# Patient Record
Sex: Male | Born: 1970 | Race: Black or African American | Hispanic: No | Marital: Single | Smoking: Never smoker
Health system: Southern US, Community
[De-identification: ages and names within clinical notes are randomized; demographics above are authoritative.]

## PROBLEM LIST (undated history)

## (undated) DIAGNOSIS — F4310 Post-traumatic stress disorder, unspecified: Secondary | ICD-10-CM

## (undated) DIAGNOSIS — F909 Attention-deficit hyperactivity disorder, unspecified type: Secondary | ICD-10-CM

## (undated) HISTORY — PX: ANKLE SURGERY: SHX546

## (undated) HISTORY — DX: Post-traumatic stress disorder, unspecified: F43.10

## (undated) HISTORY — DX: Attention-deficit hyperactivity disorder, unspecified type: F90.9

---

## 2009-09-21 ENCOUNTER — Ambulatory Visit (HOSPITAL_COMMUNITY): Admission: RE | Admit: 2009-09-21 | Discharge: 2009-09-22 | Payer: Self-pay | Admitting: Orthopedic Surgery

## 2012-01-08 ENCOUNTER — Ambulatory Visit: Payer: Self-pay | Admitting: Family Medicine

## 2012-02-05 ENCOUNTER — Ambulatory Visit: Payer: Self-pay | Admitting: Family Medicine

## 2012-02-05 DIAGNOSIS — Z0289 Encounter for other administrative examinations: Secondary | ICD-10-CM

## 2012-12-19 ENCOUNTER — Encounter: Payer: Self-pay | Admitting: Family Medicine

## 2013-01-02 ENCOUNTER — Ambulatory Visit (HOSPITAL_BASED_OUTPATIENT_CLINIC_OR_DEPARTMENT_OTHER)
Admission: RE | Admit: 2013-01-02 | Discharge: 2013-01-02 | Disposition: A | Discharge status: Home / Self Care | Payer: BC Managed Care – PPO | Source: Ambulatory Visit | Attending: Sports Medicine | Admitting: Sports Medicine

## 2013-01-02 ENCOUNTER — Ambulatory Visit (INDEPENDENT_AMBULATORY_CARE_PROVIDER_SITE_OTHER): Payer: BC Managed Care – PPO | Admitting: Sports Medicine

## 2013-01-02 ENCOUNTER — Encounter: Payer: Self-pay | Admitting: Sports Medicine

## 2013-01-02 ENCOUNTER — Ambulatory Visit: Payer: Self-pay | Admitting: Sports Medicine

## 2013-01-02 VITALS — BP 133/78 | HR 77 | Wt 228.0 lb

## 2013-01-02 DIAGNOSIS — M25519 Pain in unspecified shoulder: Secondary | ICD-10-CM

## 2013-01-02 DIAGNOSIS — M25511 Pain in right shoulder: Secondary | ICD-10-CM | POA: Insufficient documentation

## 2013-01-02 DIAGNOSIS — Z299 Encounter for prophylactic measures, unspecified: Secondary | ICD-10-CM | POA: Insufficient documentation

## 2013-01-02 MED ORDER — MELOXICAM 15 MG PO TABS: ORAL_TABLET | ORAL | Status: DC

## 2013-01-02 NOTE — Assessment & Plan Note (Signed)
Patient will return for complete physical. I am going to add some blood work so we have the results when he comes in.

## 2013-01-02 NOTE — Assessment & Plan Note (Signed)
Symptoms are specific for long head of the biceps tendinitis. Rotator cuff testing was benign. Mobic, formal physical therapy, he will cut back on his weight approximately 20% in the gym. I would like to see him back in 4 weeks if no better we can consider a guided injection into the biceps she.

## 2013-01-02 NOTE — Progress Notes (Signed)
Subjective:    CC: Establish care.   HPI:  Right shoulder pain: Present for several months, localized anterior shoulder, does not wake him from sleep, not worse with overhead activities, painful during bench press and bicep curls. Pain is localized, does not radiate although he does note occasional numbness in his pinky finger. Tried any oral medications, physical therapy for this. Pain is moderate.  Preventive measures will return for complete physical exam, would like some blood work done.  Past medical history, Surgical history, Family history not pertinant except as noted below, Social history, Allergies, and medications have been entered into the medical record, reviewed, and no changes needed.   Review of Systems: No headache, visual changes, nausea, vomiting, diarrhea, constipation, dizziness, abdominal pain, skin rash, fevers, chills, night sweats, swollen lymph nodes, weight loss, chest pain, body aches, joint swelling, muscle aches, shortness of breath, mood changes, visual or auditory hallucinations.  Objective:    General: Well Developed, well nourished, and in no acute distress.  Neuro: Alert and oriented x3, extra-ocular muscles intact, sensation grossly intact.  HEENT: Normocephalic, atraumatic, pupils equal round reactive to light, neck supple, no masses, no lymphadenopathy, thyroid nonpalpable.  Skin: Warm and dry, no rashes noted.  Cardiac: Regular rate and rhythm, no murmurs rubs or gallops.  Respiratory: Clear to auscultation bilaterally. Not using accessory muscles, speaking in full sentences.  Abdominal: Soft, nontender, nondistended, positive bowel sounds, no masses, no organomegaly.  Shoulder: Inspection reveals no abnormalities, atrophy or asymmetry. Palpation is normal with no tenderness over AC joint or bicipital groove. ROM is full in all planes. Rotator cuff strength normal throughout. No signs of impingement with negative Neer and Hawkin's tests, empty can  sign. Negative speed but positive Yergason test. No labral pathology noted with negative Obrien's, negative clunk and good stability. Normal scapular function observed. No painful arc and no drop arm sign. No apprehension sign Impression and Recommendations:   The patient was counselled, risk factors were discussed, anticipatory guidance given.

## 2013-01-06 ENCOUNTER — Ambulatory Visit: Payer: Self-pay | Admitting: Family Medicine

## 2013-01-07 ENCOUNTER — Ambulatory Visit: Payer: BC Managed Care – PPO | Admitting: Physical Therapy

## 2013-01-08 ENCOUNTER — Encounter: Payer: Self-pay | Admitting: Sports Medicine

## 2013-02-02 ENCOUNTER — Ambulatory Visit (INDEPENDENT_AMBULATORY_CARE_PROVIDER_SITE_OTHER): Payer: BC Managed Care – PPO | Admitting: Sports Medicine

## 2013-02-02 ENCOUNTER — Encounter: Payer: Self-pay | Admitting: Sports Medicine

## 2013-02-02 ENCOUNTER — Ambulatory Visit: Payer: Self-pay | Admitting: Sports Medicine

## 2013-02-02 VITALS — BP 143/88 | HR 61 | Ht 68.5 in | Wt 230.0 lb

## 2013-02-02 DIAGNOSIS — E669 Obesity, unspecified: Secondary | ICD-10-CM | POA: Insufficient documentation

## 2013-02-02 DIAGNOSIS — Z0000 Encounter for general adult medical examination without abnormal findings: Secondary | ICD-10-CM

## 2013-02-02 DIAGNOSIS — M25511 Pain in right shoulder: Secondary | ICD-10-CM

## 2013-02-02 DIAGNOSIS — Z299 Encounter for prophylactic measures, unspecified: Secondary | ICD-10-CM

## 2013-02-02 NOTE — Assessment & Plan Note (Signed)
Physical exam performed today. Awaiting blood work.

## 2013-02-02 NOTE — Progress Notes (Signed)
Subjective:    CC: Establish care.   HPI:  Complete physical exam: Doing well, no complaints, he would like to lose some weight and has been trying exercising 4 times a week. Otherwise no complaints. He did lose his job recently and is looking into other employment. He got his blood work done this morning.  Right shoulder pain: Diagnosed with biceps tendinitis by me, has been doing some rehabilitation exercises and anti-inflammatories. Overall doing much better. Pain was localized in the anterior shoulder, worse with flexion at the elbow and shoulder.  Past medical history, Surgical history, Family history not pertinant except as noted below, Social history, Allergies, and medications have been entered into the medical record, reviewed, and no changes needed.   Review of Systems: No headache, visual changes, nausea, vomiting, diarrhea, constipation, dizziness, abdominal pain, skin rash, fevers, chills, night sweats, swollen lymph nodes, weight loss, chest pain, body aches, joint swelling, muscle aches, shortness of breath, mood changes, visual or auditory hallucinations.  Objective:    General: Well Developed, well nourished, and in no acute distress.  Neuro: Alert and oriented x3, extra-ocular muscles intact, sensation grossly intact.  HEENT: Normocephalic, atraumatic, pupils equal round reactive to light, neck supple, no masses, no lymphadenopathy, thyroid nonpalpable.  Skin: Warm and dry, no rashes noted.  Cardiac: Regular rate and rhythm, no murmurs rubs or gallops.  Respiratory: Clear to auscultation bilaterally. Not using accessory muscles, speaking in full sentences.  Abdominal: Soft, nontender, nondistended, positive bowel sounds, no masses, no organomegaly.  Right Shoulder: Inspection reveals no abnormalities, atrophy or asymmetry. Palpation is normal with no tenderness over AC joint or bicipital groove. ROM is full in all planes. Rotator cuff strength normal throughout. No signs  of impingement with negative Neer and Hawkin's tests, empty can sign. Speeds and Yergason's tests normal. No labral pathology noted with negative Obrien's, negative clunk and good stability. Normal scapular function observed. No painful arc and no drop arm sign. No apprehension sign Impression and Recommendations:    The patient was counselled, risk factors were discussed, anticipatory guidance given

## 2013-02-02 NOTE — Assessment & Plan Note (Signed)
We discussed glycemic index as well as dietary strategies. He is exercising appropriately. I would like him to talk to a nutritionist. At some point in the future we could also consider weight loss medication.

## 2013-02-02 NOTE — Assessment & Plan Note (Signed)
Likely related to biceps tendinitis. Overall improving. Return as needed for this.

## 2013-02-03 ENCOUNTER — Other Ambulatory Visit: Payer: Self-pay | Admitting: Sports Medicine

## 2013-02-03 DIAGNOSIS — R718 Other abnormality of red blood cells: Secondary | ICD-10-CM | POA: Insufficient documentation

## 2013-02-03 DIAGNOSIS — E785 Hyperlipidemia, unspecified: Secondary | ICD-10-CM | POA: Insufficient documentation

## 2013-02-03 LAB — COMPREHENSIVE METABOLIC PANEL WITH GFR
ALT: 24 U/L (ref 0–53)
AST: 21 U/L (ref 0–37)
Alkaline Phosphatase: 46 U/L (ref 39–117)
Creat: 0.9 mg/dL (ref 0.50–1.35)
Total Bilirubin: 0.5 mg/dL (ref 0.3–1.2)

## 2013-02-03 LAB — COMPREHENSIVE METABOLIC PANEL
Albumin: 4.3 g/dL (ref 3.5–5.2)
BUN: 13 mg/dL (ref 6–23)
CO2: 26 mEq/L (ref 19–32)
Calcium: 9.4 mg/dL (ref 8.4–10.5)
Chloride: 100 mEq/L (ref 96–112)
Glucose, Bld: 100 mg/dL — ABNORMAL HIGH (ref 70–99)
Potassium: 4.5 mEq/L (ref 3.5–5.3)
Sodium: 135 mEq/L (ref 135–145)
Total Protein: 7.4 g/dL (ref 6.0–8.3)

## 2013-02-03 LAB — LIPID PANEL
Cholesterol: 254 mg/dL — ABNORMAL HIGH (ref 0–200)
HDL: 55 mg/dL (ref >39)
LDL Cholesterol: 179 mg/dL — ABNORMAL HIGH (ref 0–99)
Total CHOL/HDL Ratio: 4.6 ratio
Triglycerides: 100 mg/dL (ref <150)
VLDL: 20 mg/dL (ref 0–40)

## 2013-02-03 LAB — TESTOSTERONE, FREE, TOTAL, SHBG
Sex Hormone Binding: 31 nmol/L (ref 13–71)
Testosterone, Free: 81.9 pg/mL (ref 47.0–244.0)
Testosterone-% Free: 2.1 % (ref 1.6–2.9)
Testosterone: 390 ng/dL (ref 300–890)

## 2013-02-03 LAB — TSH: TSH: 2.815 u[IU]/mL (ref 0.350–4.500)

## 2013-02-03 LAB — CBC
HCT: 48.1 % (ref 39.0–52.0)
Hemoglobin: 15.9 g/dL (ref 13.0–17.0)
MCH: 25.2 pg — ABNORMAL LOW (ref 26.0–34.0)
MCHC: 33.1 g/dL (ref 30.0–36.0)
MCV: 76.3 fL — ABNORMAL LOW (ref 78.0–100.0)
Platelets: 278 K/uL (ref 150–400)
RBC: 6.3 MIL/uL — ABNORMAL HIGH (ref 4.22–5.81)
RDW: 15.4 % (ref 11.5–15.5)
WBC: 5.9 K/uL (ref 4.0–10.5)

## 2013-02-03 LAB — VITAMIN D 25 HYDROXY (VIT D DEFICIENCY, FRACTURES): Vit D, 25-Hydroxy: 14 ng/mL — ABNORMAL LOW (ref 30–89)

## 2013-02-03 MED ORDER — VITAMIN D (ERGOCALCIFEROL) 1.25 MG (50000 UNIT) PO CAPS: 50000.0000 [IU] | ORAL_CAPSULE | ORAL | Status: DC

## 2013-02-03 MED ORDER — ATORVASTATIN CALCIUM 40 MG PO TABS: 40.0000 mg | ORAL_TABLET | Freq: Every day | ORAL | Status: DC

## 2013-02-06 ENCOUNTER — Encounter: Payer: Self-pay | Admitting: Dietician

## 2013-02-06 ENCOUNTER — Encounter: Payer: BC Managed Care – PPO | Attending: Sports Medicine | Admitting: Dietician

## 2013-02-06 VITALS — Ht 69.0 in | Wt 227.4 lb

## 2013-02-06 DIAGNOSIS — E785 Hyperlipidemia, unspecified: Secondary | ICD-10-CM

## 2013-02-06 DIAGNOSIS — E669 Obesity, unspecified: Secondary | ICD-10-CM | POA: Insufficient documentation

## 2013-02-06 DIAGNOSIS — Z713 Dietary counseling and surveillance: Secondary | ICD-10-CM | POA: Insufficient documentation

## 2013-02-06 NOTE — Progress Notes (Signed)
Medical Nutrition Therapy:  Appt start time: 0800 end time:  0900.  Assessment:  Primary concerns today: wt, hyoercholesterolemia.   MEDICATIONS: see list.    DIETARY INTAKE:  Usual eating pattern includes 2 meals and 0 snacks per day.  Everyday foods include juice, deli meat, cheese.  Avoided foods include deep fried items.    24-hr recall:  B ( AM): only 3-4 days per week, scrambled eggs with onions, OJ.  Snk ( AM): none  L ( PM): sometimes cheese and deli meat, sometimes fish. Juice, soda, or water Snk ( PM): none D ( PM): variety of home cooked meals, 3 nights a week eats out. Wings at [employer], [employer] burrito bowl (rice, beans, beef, corn, cheese), steak.  Snk ( PM): none Beverages: water, juice, soda, beer (9 a week)  Pt claims lately very low appetite for last 2-3 weeks, so recall reflects lower than usual intake. Pt has recently started eating more fish, after eating lots of beef and chix.   Pt is taking Hydroxycut, has been for about 1 month to date.   Usual physical activity: 3-4 days a week of gym: 20 minutes cardio, then weights 45 minutes.   Pt claims he has been at similar wt for about 1.5-2 years, but feels out of shape, and that body comp has swung toward fat away from muscle.   Progress Towards Goal(s):  In progress.   Nutritional Diagnosis:  Glide-3.3 Overweight/obesity As related to sedentary lifestyle prior to past month, high intake of high kcal foods, such as meats and cheeses, juices, sodas.  As evidenced by BMI>30.    Intervention:  Nutrition counseling provided regarding wt loss approaches, portion control, greater inclusion of nonstarchy vegetables. RD also advised pt on improving vitamin D status through supplementation, increased dairy intake. RD advised additional increases in PA to aid in wt loss, alleviate dyslipidemia. RD advised pt on tenets of the TLC diet, with emphasis on types of fats to limit and to include to resolve hypercholesterolemia. RD  provided some guidance on exercise planning and flexibility at pt's request. RD explained the composition of hydroxycut supplement and advised pt to wean off over the next week.  Handouts given during visit include:  Plate Method  Stretching/Flexibility guide  Goals: Mr. Heber will follow the Plate Method eating plan for dinner, and most lunches. Mr. Mcquigg will eat breakfast at least 5 days a week, and will include a high protein food. Mr. Stepter will choose whole fruit in place of fruit juice, and diet soda in place of regular soda. Mr. Kalb will take 4,000 IUs each day of Vitamin D3 (aka cholecalciferol).  Mr. Po will choose lean proteins and plant-based fats to improve blood lipid profile: loin cuts of red meats over ribs cuts, no skin on poultry, limit eggs to one whole per day (egg whites unlimited), and cook with oils over butter, creams, and lards.  Mr. Bodi will walk, bike, etc. 20-30 minutes on days not at the gym. Mr. Reinders will discontinue hydroxycut.  Monitoring/Evaluation:  Dietary intake, exercise, portion control, and body weight in 2 month(s).

## 2013-02-09 ENCOUNTER — Other Ambulatory Visit: Payer: Self-pay | Admitting: Sports Medicine

## 2013-02-09 LAB — HEMOGLOBINOPATHY EVALUATION
Hemoglobin Other: 0 %
Hgb A2 Quant: 2.6 % (ref 2.2–3.2)
Hgb A: 97.4 % (ref 96.8–97.8)
Hgb F Quant: 0 % (ref 0.0–2.0)
Hgb S Quant: 0 %

## 2013-04-03 ENCOUNTER — Ambulatory Visit: Payer: Self-pay | Admitting: Dietician

## 2014-01-21 ENCOUNTER — Ambulatory Visit (INDEPENDENT_AMBULATORY_CARE_PROVIDER_SITE_OTHER): Payer: BC Managed Care – PPO | Admitting: Sports Medicine

## 2014-01-21 DIAGNOSIS — R590 Localized enlarged lymph nodes: Secondary | ICD-10-CM | POA: Insufficient documentation

## 2014-01-21 DIAGNOSIS — R599 Enlarged lymph nodes, unspecified: Secondary | ICD-10-CM

## 2014-01-21 MED ORDER — AZITHROMYCIN 250 MG PO TABS: ORAL_TABLET | ORAL | Status: DC

## 2014-01-21 NOTE — Patient Instructions (Signed)
Lymphadenopathy °Lymphadenopathy means "disease of the lymph glands." But the term is usually used to describe swollen or enlarged lymph glands, also called lymph nodes. These are the bean-shaped organs found in many locations including the neck, underarm, and groin. Lymph glands are part of the immune system, which fights infections in your body. Lymphadenopathy can occur in just one area of the body, such as the neck, or it can be generalized, with lymph node enlargement in several areas. The nodes found in the neck are the most common sites of lymphadenopathy. °CAUSES  °When your immune system responds to germs (such as viruses or bacteria ), infection-fighting cells and fluid build up. This causes the glands to grow in size. This is usually not something to worry about. Sometimes, the glands themselves can become infected and inflamed. This is called lymphadenitis. °Enlarged lymph nodes can be caused by many diseases: °· Bacterial disease, such as strep throat or a skin infection. °· Viral disease, such as a common cold. °· Other germs, such as lyme disease, tuberculosis, or sexually transmitted diseases. °· Cancers, such as lymphoma (cancer of the lymphatic system) or leukemia (cancer of the white blood cells). °· Inflammatory diseases such as lupus or rheumatoid arthritis. °· Reactions to medications. °Many of the diseases above are rare, but important. This is why you should see your caregiver if you have lymphadenopathy. °SYMPTOMS  °· Swollen, enlarged lumps in the neck, back of the head or other locations. °· Tenderness. °· Warmth or redness of the skin over the lymph nodes. °· Fever. °DIAGNOSIS  °Enlarged lymph nodes are often near the source of infection. They can help healthcare providers diagnose your illness. For instance:  °· Swollen lymph nodes around the jaw might be caused by an infection in the mouth. °· Enlarged glands in the neck often signal a throat infection. °· Lymph nodes that are swollen  in more than one area often indicate an illness caused by a virus. °Your caregiver most likely will know what is causing your lymphadenopathy after listening to your history and examining you. Blood tests, x-rays or other tests may be needed. If the cause of the enlarged lymph node cannot be found, and it does not go away by itself, then a biopsy may be needed. Your caregiver will discuss this with you. °TREATMENT  °Treatment for your enlarged lymph nodes will depend on the cause. Many times the nodes will shrink to normal size by themselves, with no treatment. Antibiotics or other medicines may be needed for infection. Only take over-the-counter or prescription medicines for pain, discomfort or fever as directed by your caregiver. °HOME CARE INSTRUCTIONS  °Swollen lymph glands usually return to normal when the underlying medical condition goes away. If they persist, contact your health-care provider. He/she might prescribe antibiotics or other treatments, depending on the diagnosis. Take any medications exactly as prescribed. Keep any follow-up appointments made to check on the condition of your enlarged nodes.  °SEEK MEDICAL CARE IF:  °· Swelling lasts for more than two weeks. °· You have symptoms such as weight loss, night sweats, fatigue or fever that does not go away. °· The lymph nodes are hard, seem fixed to the skin or are growing rapidly. °· Skin over the lymph nodes is red and inflamed. This could mean there is an infection. °SEEK IMMEDIATE MEDICAL CARE IF:  °· Fluid starts leaking from the area of the enlarged lymph node. °· You develop a fever of 102° F (38.9° C) or greater. °· Severe   pain develops (not necessarily at the site of a large lymph node). °· You develop chest pain or shortness of breath. °· You develop worsening abdominal pain. °MAKE SURE YOU:  °· Understand these instructions. °· Will watch your condition. °· Will get help right away if you are not doing well or get worse. °Document  Released: 08/07/2008 Document Revised: 01/21/2012 Document Reviewed: 08/07/2008 °[employer]® Patient Information ©2014 [employer]. ° °

## 2014-01-21 NOTE — Progress Notes (Signed)
  Subjective:    CC: Neck pain  HPI: Patient is a pleasant 43 yo male with a 2 week history of neck pain. The pain is intermittent and small lump over the left submandibular lymph nodes. The pain is 3/10 at the worst. Nothing specific brings it on. Has not tried anything to relieve the pain. Denies URI symptoms, fever, chills or headaches. Mild, persistent.  Past medical history, Surgical history, Family history not pertinant except as noted below, Social history, Allergies, and medications have been entered into the medical record, reviewed, and no changes needed.   Review of Systems: No fevers, chills, night sweats, weight loss, chest pain, or shortness of breath.   Objective:    General: Well Developed, well nourished, and in no acute distress.  Neuro: Alert and oriented x3, extra-ocular muscles intact, sensation grossly intact.  HEENT: Normocephalic, atraumatic, pupils equal round reactive to light, neck supple, no masses, lymphadenopathy in the left submandibular gland, thyroid nonpalpable, small vesicles on the soft palate.  Skin: Warm and dry, no rashes. Cardiac: Regular rate and rhythm, no murmurs rubs or gallops, no lower extremity edema.  Respiratory: Clear to auscultation bilaterally. Not using accessory muscles, speaking in full sentences.  Impression and Recommendations:

## 2014-01-21 NOTE — Assessment & Plan Note (Signed)
He does have some vesicles on the right side and soft palate, and left-sided lymphadenopathy, but interestingly no URI symptoms whatsoever. We will treat this conservatively, it has been present 2 weeks we will add a course of azithromycin. Return to see me in one month, if no better we will proceed with blood work and imaging. Clinically I do think this represents herpangina.

## 2014-02-18 ENCOUNTER — Ambulatory Visit: Payer: Self-pay | Admitting: Sports Medicine

## 2014-03-05 ENCOUNTER — Telehealth: Payer: Self-pay | Admitting: Sports Medicine

## 2014-03-05 ENCOUNTER — Ambulatory Visit (INDEPENDENT_AMBULATORY_CARE_PROVIDER_SITE_OTHER): Payer: BC Managed Care – PPO | Admitting: Sports Medicine

## 2014-03-05 ENCOUNTER — Encounter: Payer: Self-pay | Admitting: Sports Medicine

## 2014-03-05 VITALS — BP 152/91 | HR 69 | Ht 69.0 in | Wt 235.0 lb

## 2014-03-05 DIAGNOSIS — R599 Enlarged lymph nodes, unspecified: Secondary | ICD-10-CM

## 2014-03-05 DIAGNOSIS — E785 Hyperlipidemia, unspecified: Secondary | ICD-10-CM

## 2014-03-05 DIAGNOSIS — H1010 Acute atopic conjunctivitis, unspecified eye: Secondary | ICD-10-CM | POA: Insufficient documentation

## 2014-03-05 DIAGNOSIS — H1045 Other chronic allergic conjunctivitis: Secondary | ICD-10-CM

## 2014-03-05 DIAGNOSIS — R590 Localized enlarged lymph nodes: Secondary | ICD-10-CM

## 2014-03-05 MED ORDER — AZELASTINE HCL 0.05 % OP SOLN: 2.0000 [drp] | Freq: Two times a day (BID) | OPHTHALMIC | Status: DC

## 2014-03-05 NOTE — Assessment & Plan Note (Signed)
Return in 2 months to consider recheck.

## 2014-03-05 NOTE — Telephone Encounter (Signed)
Patient request to know if his pain coming from his eye around the side to his head is related to stress, he said he forgot to ask you. And he request to know if he can be written out of work for a week or so due to his high blood pressure, which is related to his job stress. Thanks

## 2014-03-05 NOTE — Assessment & Plan Note (Signed)
This occurred concurrently with herpangina. All symptoms have now resolved.

## 2014-03-05 NOTE — Telephone Encounter (Signed)
It's probably not related to stress, note is in my box.

## 2014-03-05 NOTE — Progress Notes (Signed)
  Subjective:    CC:  Followup  HPI: Lymphadenopathy: Jonathon Patterson came to see me 2 weeks ago, he had left cervical lymphadenopathy with soft palate vesicles suggestive of herpangina. We treated him conservatively and all symptoms have resolved.  Itchy eyes: Happened every season, moderate, persistent, has tried Claritin, Zyrtec, nothing as helped.  Hyperlipidemia: Stable on atorvastatin, we will probably recheck in 2 months.  Past medical history, Surgical history, Family history not pertinant except as noted below, Social history, Allergies, and medications have been entered into the medical record, reviewed, and no changes needed.   Review of Systems: No fevers, chills, night sweats, weight loss, chest pain, or shortness of breath.   Objective:    General: Well Developed, well nourished, and in no acute distress.  Neuro: Alert and oriented x3, extra-ocular muscles intact, sensation grossly intact.  HEENT: Normocephalic, atraumatic, pupils equal round reactive to light, neck supple, no masses, no lymphadenopathy, thyroid nonpalpable. Conjunctiva are mildly injected without discharge. Oropharynx, nasopharynx, external ear canals are unremarkable. Skin: Warm and dry, no rashes. Cardiac: Regular rate and rhythm, no murmurs rubs or gallops, no lower extremity edema.  Respiratory: Clear to auscultation bilaterally. Not using accessory muscles, speaking in full sentences.  Impression and Recommendations:

## 2014-03-10 ENCOUNTER — Ambulatory Visit (INDEPENDENT_AMBULATORY_CARE_PROVIDER_SITE_OTHER): Payer: BC Managed Care – PPO | Admitting: Sports Medicine

## 2014-03-10 ENCOUNTER — Encounter: Payer: Self-pay | Admitting: Sports Medicine

## 2014-03-10 VITALS — BP 144/82 | HR 67 | Ht 69.0 in | Wt 236.0 lb

## 2014-03-10 DIAGNOSIS — E669 Obesity, unspecified: Secondary | ICD-10-CM

## 2014-03-10 DIAGNOSIS — H1010 Acute atopic conjunctivitis, unspecified eye: Secondary | ICD-10-CM

## 2014-03-10 DIAGNOSIS — H1045 Other chronic allergic conjunctivitis: Secondary | ICD-10-CM

## 2014-03-10 DIAGNOSIS — M25511 Pain in right shoulder: Secondary | ICD-10-CM

## 2014-03-10 DIAGNOSIS — I10 Essential (primary) hypertension: Secondary | ICD-10-CM

## 2014-03-10 DIAGNOSIS — M25519 Pain in unspecified shoulder: Secondary | ICD-10-CM

## 2014-03-10 MED ORDER — FLUTICASONE PROPIONATE 50 MCG/ACT NA SUSP: NASAL | Status: DC

## 2014-03-10 MED ORDER — CREATINE MONOHYDRATE POWD: Status: DC

## 2014-03-10 NOTE — Assessment & Plan Note (Signed)
He will continue to work on decreasing the sodium in his diet. I have also given him an exercise prescription. Return 2 weeks, if no improvement in blood pressure, we will start hydrochlorothiazide.

## 2014-03-10 NOTE — Assessment & Plan Note (Signed)
Does get some delayed onset muscle soreness after working out, I have recommended some creatine to improve his recovery after workouts.

## 2014-03-10 NOTE — Progress Notes (Signed)
  Subjective:    CC: Blood pressure  HPI: Hypertension: Continue to be elevated, it has improved slightly since the last visit and he has been working on cutting back his salt intake. He is also been trying to work out and had several questions about this that were answered.  Obesity: Continues to improve, did have several questions regarding weight training, exercise prescription, dieting, all of which were answered.  Allergies: Does have allergic conjunctivitis, Optivar has helped her significantly, does desire Flonase.  Past medical history, Surgical history, Family history not pertinant except as noted below, Social history, Allergies, and medications have been entered into the medical record, reviewed, and no changes needed.   Review of Systems: No fevers, chills, night sweats, weight loss, chest pain, or shortness of breath.   Objective:    General: Well Developed, well nourished, and in no acute distress.  Neuro: Alert and oriented x3, extra-ocular muscles intact, sensation grossly intact.  HEENT: Normocephalic, atraumatic, pupils equal round reactive to light, neck supple, no masses, no lymphadenopathy, thyroid nonpalpable.  Skin: Warm and dry, no rashes. Cardiac: Regular rate and rhythm, no murmurs rubs or gallops, no lower extremity edema.  Respiratory: Clear to auscultation bilaterally. Not using accessory muscles, speaking in full sentences.  Impression and Recommendations:

## 2014-03-10 NOTE — Assessment & Plan Note (Signed)
We discussed an exercise prescription to help him lose weight as well as cutting back on carbohydrates, like to see him back monthly for this.

## 2014-03-10 NOTE — Assessment & Plan Note (Signed)
Adding fluticasone nasal.

## 2014-03-24 ENCOUNTER — Ambulatory Visit: Payer: Self-pay | Admitting: Sports Medicine

## 2015-06-27 ENCOUNTER — Encounter: Payer: Self-pay | Admitting: Sports Medicine

## 2015-06-30 ENCOUNTER — Ambulatory Visit (INDEPENDENT_AMBULATORY_CARE_PROVIDER_SITE_OTHER): Payer: 59

## 2015-06-30 ENCOUNTER — Ambulatory Visit (INDEPENDENT_AMBULATORY_CARE_PROVIDER_SITE_OTHER): Payer: 59 | Admitting: Sports Medicine

## 2015-06-30 ENCOUNTER — Encounter: Payer: Self-pay | Admitting: Sports Medicine

## 2015-06-30 VITALS — BP 133/84 | HR 69 | Ht 69.0 in | Wt 224.0 lb

## 2015-06-30 DIAGNOSIS — M25571 Pain in right ankle and joints of right foot: Secondary | ICD-10-CM

## 2015-06-30 DIAGNOSIS — G4719 Other hypersomnia: Secondary | ICD-10-CM

## 2015-06-30 DIAGNOSIS — E669 Obesity, unspecified: Secondary | ICD-10-CM

## 2015-06-30 DIAGNOSIS — I10 Essential (primary) hypertension: Secondary | ICD-10-CM

## 2015-06-30 DIAGNOSIS — M19071 Primary osteoarthritis, right ankle and foot: Secondary | ICD-10-CM | POA: Insufficient documentation

## 2015-06-30 DIAGNOSIS — E785 Hyperlipidemia, unspecified: Secondary | ICD-10-CM | POA: Diagnosis not present

## 2015-06-30 DIAGNOSIS — R718 Other abnormality of red blood cells: Secondary | ICD-10-CM | POA: Diagnosis not present

## 2015-06-30 LAB — IRON AND TIBC
%SAT: 23 % (ref 20–55)
Iron: 76 ug/dL (ref 42–165)
TIBC: 337 ug/dL (ref 215–435)
UIBC: 261 ug/dL (ref 125–400)

## 2015-06-30 LAB — LIPID PANEL
Cholesterol: 237 mg/dL — ABNORMAL HIGH (ref 125–200)
HDL: 51 mg/dL (ref >=40)
LDL Cholesterol: 167 mg/dL — ABNORMAL HIGH (ref <130)
Total CHOL/HDL Ratio: 4.6 Ratio (ref <=5.0)
Triglycerides: 97 mg/dL (ref <150)
VLDL: 19 mg/dL (ref <30)

## 2015-06-30 LAB — CBC
HCT: 45.6 % (ref 39.0–52.0)
Hemoglobin: 15.3 g/dL (ref 13.0–17.0)
MCH: 26.8 pg (ref 26.0–34.0)
MCHC: 33.6 g/dL (ref 30.0–36.0)
MCV: 79.9 fL (ref 78.0–100.0)
MPV: 10.2 fL (ref 8.6–12.4)
Platelets: 304 K/uL (ref 150–400)
RBC: 5.71 MIL/uL (ref 4.22–5.81)
RDW: 16.3 % — ABNORMAL HIGH (ref 11.5–15.5)
WBC: 5.4 10*3/uL (ref 4.0–10.5)

## 2015-06-30 LAB — HEMOGLOBIN A1C
Hgb A1c MFr Bld: 5.6 % (ref <5.7)
Mean Plasma Glucose: 114 mg/dL (ref <117)

## 2015-06-30 LAB — COMPREHENSIVE METABOLIC PANEL WITH GFR
AST: 23 U/L (ref 10–40)
BUN: 9 mg/dL (ref 7–25)
CO2: 25 mmol/L (ref 20–31)
Chloride: 103 mmol/L (ref 98–110)
Creat: 1 mg/dL (ref 0.60–1.35)
Sodium: 138 mmol/L (ref 135–146)

## 2015-06-30 LAB — COMPREHENSIVE METABOLIC PANEL
ALT: 19 U/L (ref 9–46)
Albumin: 4.4 g/dL (ref 3.6–5.1)
Alkaline Phosphatase: 43 U/L (ref 40–115)
Calcium: 9.1 mg/dL (ref 8.6–10.3)
Glucose, Bld: 92 mg/dL (ref 65–99)
Potassium: 4.2 mmol/L (ref 3.5–5.3)
Total Bilirubin: 0.5 mg/dL (ref 0.2–1.2)
Total Protein: 7.7 g/dL (ref 6.1–8.1)

## 2015-06-30 MED ORDER — MELOXICAM 15 MG PO TABS: ORAL_TABLET | ORAL | Status: DC

## 2015-06-30 MED ORDER — PHENTERMINE HCL 37.5 MG PO TABS: ORAL_TABLET | ORAL | Status: DC

## 2015-06-30 NOTE — Assessment & Plan Note (Signed)
Starting phentermine. Return monthly for weight checks and refills. 

## 2015-06-30 NOTE — Assessment & Plan Note (Signed)
This will likely improve this patient was as the patient loses weight.

## 2015-06-30 NOTE — Progress Notes (Signed)
  Subjective:    CC:  Follow-up  HPI: This is a pleasant 44 year old male, have not seen him in over one year. He has a few issues to discuss.   Obesity: hasn't been able to lose 10-20 pounds however is struggling to lose further, he has tried dieting, exercise, and wonders what else can be done.   Sleep apnea: Snoring, 3-4 hours of sleep per night, excessive daytime sleepiness.   Right ankle pain: Pain is moderate, persistent, present for years, he is post ORIF for an ankle fracture, pain is localized both of the anterior ankle as well as behind the medial malleolus.   Microcytosis: We were never able to work this up previously, patient was lost to follow-up. He was not anemic, and hemoglobin electrophoresis was normal.   hyperlipidemia: Again, lost a follow-up, agrees to try weight loss before doing any antihyperlipidemics  Past medical history, Surgical history, Family history not pertinant except as noted below, Social history, Allergies, and medications have been entered into the medical record, reviewed, and no changes needed.   Review of Systems: No fevers, chills, night sweats, weight loss, chest pain, or shortness of breath.   Objective:    General: Well Developed, well nourished, and in no acute distress.  Neuro: Alert and oriented x3, extra-ocular muscles intact, sensation grossly intact.  HEENT: Normocephalic, atraumatic, pupils equal round reactive to light, neck supple, no masses, no lymphadenopathy, thyroid nonpalpable.  Skin: Warm and dry, no rashes. Cardiac: Regular rate and rhythm, no murmurs rubs or gallops, no lower extremity edema.  Respiratory: Clear to auscultation bilaterally. Not using accessory muscles, speaking in full sentences. Right Ankle: Or eschar over the anterior ankle, tender to palpation over the talar dome. Range of motion is full in all directions. Strength is 5/5 in all directions. Stable lateral and medial ligaments; squeeze test and kleiger  test unremarkable; Talar dome nontender; No pain at base of 5th MT; No tenderness over cuboid; No tenderness over N spot or navicular prominence Tender behind the medial malleolus with recurrence of pain with resisted inversion. No sign of peroneal tendon subluxations; Negative tarsal tunnel tinel's Able to walk 4 steps.  Impression and Recommendations:

## 2015-06-30 NOTE — Assessment & Plan Note (Signed)
Rechecking CBC, anemia panel

## 2015-06-30 NOTE — Assessment & Plan Note (Signed)
Pain predominantly over the tibialis posterior, he is post-ORIF right ankle fracture, at this point we need baseline x-rays, formal PT, adding meloxicam and patient will return for custom orthotics.

## 2015-06-30 NOTE — Assessment & Plan Note (Signed)
Look ok today.

## 2015-06-30 NOTE — Assessment & Plan Note (Signed)
Referral for sleep studies, as he was his weight sleep apnea symptoms will probably improve.

## 2015-07-01 LAB — VITAMIN B12: Vitamin B-12: 726 pg/mL (ref 211–911)

## 2015-07-01 LAB — PATHOLOGIST SMEAR REVIEW

## 2015-07-01 LAB — TSH: TSH: 2.178 u[IU]/mL (ref 0.350–4.500)

## 2015-07-01 LAB — FOLATE: Folate: 6.8 ng/mL

## 2015-07-01 LAB — FERRITIN: Ferritin: 298 ng/mL (ref 22–322)

## 2015-07-08 ENCOUNTER — Ambulatory Visit (INDEPENDENT_AMBULATORY_CARE_PROVIDER_SITE_OTHER): Payer: 59 | Admitting: Rehabilitative and Restorative Service Providers"

## 2015-07-08 ENCOUNTER — Encounter: Payer: Self-pay | Admitting: Rehabilitative and Restorative Service Providers"

## 2015-07-08 DIAGNOSIS — M25671 Stiffness of right ankle, not elsewhere classified: Secondary | ICD-10-CM | POA: Diagnosis not present

## 2015-07-08 DIAGNOSIS — Z7409 Other reduced mobility: Secondary | ICD-10-CM | POA: Diagnosis not present

## 2015-07-08 DIAGNOSIS — M25571 Pain in right ankle and joints of right foot: Secondary | ICD-10-CM

## 2015-07-08 DIAGNOSIS — R29898 Other symptoms and signs involving the musculoskeletal system: Secondary | ICD-10-CM

## 2015-07-08 DIAGNOSIS — R6889 Other general symptoms and signs: Secondary | ICD-10-CM

## 2015-07-08 DIAGNOSIS — R531 Weakness: Secondary | ICD-10-CM

## 2015-07-08 NOTE — Patient Instructions (Signed)
Achilles / Soleus, Standing   Stand, right foot behind, heel on floor and turned slightly out. Lower hips and bend knees. Hold 30___ seconds. Repeat _3__ times per session. Do _2-3__ sessions per day.    Gastroc Stretch   Stand with right foot back, leg straight, forward leg bent. Keeping heel on floor, turned slightly out, lean into wall until stretch is felt in calf. Hold _30___ seconds. Repeat _3___ times per set. Do _2-3___ times per day.   HIP: Hamstrings - Supine   Place strap around foot. Raise leg up, keep knee straight. Hold _30_ seconds. _3__ reps per set, 2-3___ sets per day   . Piriformis Stretch   Lying on back, pull right knee toward opposite shoulder. Hold _30___ seconds. Repeat __3__ times. Do __2-3__ sessions per day.  SINGLE LIMB STANCE   Stance: single leg on floor. Raise leg. Hold _60__ seconds. Repeat with other leg. __3-5_ reps per set, _2-3 tinme__ sets per day  Leg swing - single leg stance  In doorway 30-60 sec  3-5 reps

## 2015-07-08 NOTE — Therapy (Addendum)
[place] [address] [place], [place], [address] Phone: [phone]   Fax:  [phone]  Physical Therapy Evaluation  Patient Details  Name: Jonathon Patterson MRN: [id number] Date of Birth: [birth year] Referring Provider:  Silverio Decamp,*  Encounter Date: 07/08/2015      PT End of Session - 07/08/15 1611    Visit Number 1   Number of Visits 6   Date for PT Re-Evaluation 08/19/15   PT Start Time 0786   PT Stop Time 1500   PT Time Calculation (min) 55 min   Activity Tolerance Patient tolerated treatment well      History reviewed. No pertinent past medical history.  Past Surgical History  Procedure Laterality Date  . Ankle surgery      There were no vitals filed for this visit.  Visit Diagnosis:  Ankle pain, right - Plan: PT plan of care cert/re-cert  Stiffness of right ankle joint - Plan: PT plan of care cert/re-cert  Weakness of right lower extremity - Plan: PT plan of care cert/re-cert  Decreased strength, endurance, and mobility - Plan: PT plan of care cert/re-cert      Subjective Assessment - 07/08/15 1406    Subjective Jonathon Patterson reports that he fractured Rt ankle ~20 years ago with ORIF at the time of the break. He had a surgery to remove pins ~ 3-4 years ago with no improvement in symptoms. He continued pain with activities and sometimes for no reason. Had more pain with physical activity; now noticing pain in the calf and arch.  Symptoms have increased in the past year.   Pertinent History Denies any other medical problems   How long can you sit comfortably? no limits sitting - pain upon standing after sitting for ~1 hour   How long can you stand comfortably? 1 hours   How long can you walk comfortably?  45 min -1 hour/ stair master at gym 15 min    Diagnostic tests xrays    Patient Stated Goals Wants ankle pain to go away and wants to run again - (has not run in 20 years)             [facility] PT Assessment - 07/08/15 0001    Assessment   Medical Diagnosis Rt ankle pain   Onset Date/Surgical Date --  fx 20 yr ago/removal of hardware 3-4 yr ago/symptoms inc 1 y   Hand Dominance Left   Next MD Visit 09/16   Precautions   Precautions None   Precaution Comments will receive injection in Rt ankle at next MD appt   Required Braces or Orthoses --  custom orthotic at next visit   Balance Screen   Has the patient fallen in the past 6 months No   Has the patient had a decrease in activity level because of a fear of falling?  No   Is the patient reluctant to leave their home because of a fear of falling?  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   Leisure gym 3-4 days/wk - lifting weights(machines and free weights) eliptical 30 min stair master 15 min; riding bike 2 days/wk ~ 1 hour    Observation/Other Assessments   Focus on Therapeutic Outcomes (FOTO)  51% limitation  Sensation   Additional Comments WNL's per patient report   Posture/Postural Control   Posture Comments decreased longitudinal arch bilat Rt > Lt    AROM   Right Ankle Dorsiflexion 0   Right Ankle Plantar Flexion 37   Right Ankle Inversion 28   Right Ankle Eversion 5   Left Ankle Dorsiflexion 6   Left Ankle Plantar Flexion 44   Left Ankle Inversion 37   Left Ankle Eversion 9   Strength   Right Ankle Dorsiflexion --  5-/5   Right Ankle Plantar Flexion 5/5  heel raise x10 painful   Right Ankle Inversion 4/5   Right Ankle Eversion 4+/5   Flexibility   Hamstrings Lt 65 deg/ RT 61   Quadriceps 6 in heel to hip bilat greater stretch Rt than Lt   ITB tight Rt   Piriformis tight Rt                   [facility] Adult PT Treatment/Exercise - 07/08/15 0001     Vasopneumatic   Number Minutes Vasopneumatic  15 minutes   Vasopnuematic Location  Ankle   Vasopneumatic Pressure Medium   Vasopneumatic Temperature  3*   Ankle Exercises: Stretches   Soleus Stretch 3 reps;30 seconds   Gastroc Stretch 3 reps;30 seconds   Other Stretch hamstring supine 30 sec 3 reps with strap   Other Stretch piriformis supine 30 sec 3 reps   Ankle Exercises: Standing   SLS 3 reps 30 sec   SLS with leg swing 30 sec 3 reps   Rebounder 3 reps 30 sec SLS                PT Education - 07/08/15 1604    Education provided Yes   Education Details Petra Kuba of chronic conditions; running may not be a realistic goal - may work to increase time for aerobic equipment in gym. HEP - stretching and strengthening   Person(s) Educated Patient   Methods Explanation;Demonstration;Tactile cues;Verbal cues;Handout   Comprehension Verbalized understanding;Returned demonstration;Verbal cues required;Tactile cues required             PT Long Term Goals - 07/08/15 1615    PT LONG TERM GOAL #1   Title Patient I in HEP for discharge 08/19/15   Time 6   Period Weeks   Status New   PT LONG TERM GOAL #2   Title Increase AROM Rt ankle 2-5 degrees 08/19/15   Time 6   Period Weeks   Status New   PT LONG TERM GOAL #3   Title 5-/5 to 5/5 Rt ankle strength 08/19/15   Time 6   Period Weeks   Status New   PT LONG TERM GOAL #4   Title Increase activity tolerance - patient to tolerate eliptical for 20-30 min 08/19/15   Time 6   Period Weeks   Status New   PT LONG TERM GOAL #5   Title Decreased FOTO to </= 39% limitation 08/19/15   Time 6   Period Weeks   Status New               Plan - 07/08/15 1612    Clinical Impression Statement patient presents with chronic Rt ankle dysfunction including pain with functional activities; limited ROM; decreased strength; limited endurance and activity level. He will benefit from treatment to address limitatiions and increase  functional activity level.    Pt will benefit from skilled therapeutic intervention in order to improve on the following deficits Pain;Decreased  range of motion;Decreased mobility;Decreased strength;Decreased activity tolerance;Decreased endurance   Rehab Potential Good   PT Frequency 1x / week   PT Duration 6 weeks   PT Treatment/Interventions Patient/family education;ADLs/Self Care Home Management;Therapeutic exercise;Therapeutic activities;Manual techniques;Cryotherapy;Electrical Stimulation;Iontophoresis 59m/ml Dexamethasone;Moist Heat;Ultrasound;Balance training   PT Next Visit Plan Revies HEP; add quad stretch with hip extension; trial of ankle mobs; Progress with strengthening Rt ankle/LE   PT Home Exercise Plan HEP   Consulted and Agree with Plan of Care Patient         Problem List Patient Active Problem List   Diagnosis Date Noted  . Excessive daytime sleepiness 06/30/2015  . Right ankle pain 06/30/2015  . Essential hypertension, benign 03/10/2014  . Allergic conjunctivitis 03/05/2014  . Microcytosis 02/03/2013  . Hyperlipidemia 02/03/2013  . Obesity (BMI 30-39.9) 02/02/2013  . Preventive measure 01/02/2013    Ralf Konopka PNilda SimmerPT, MPH 07/08/2015, 4:22 PM  [facility] [address] [address]Phone: [phone]  Fax:  [phone]   PHYSICAL THERAPY DISCHARGE SUMMARY  Visits from Start of Care:Evaluation only  Current functional level related to goals / functional outcomes: unchanged   Remaining deficits: unknown   Education / Equipment: HEP  Plan: Patient agrees to discharge.  Patient goals were not met. Patient is being discharged due to not returning since the last visit.  ?????     Cleaven Demario P. HHelene KelpPT, MPH 08/11/2015 12:11 PM

## 2015-07-13 ENCOUNTER — Encounter: Payer: Self-pay | Admitting: Rehabilitative and Restorative Service Providers"

## 2015-07-21 ENCOUNTER — Encounter: Payer: Self-pay | Admitting: Sports Medicine

## 2015-07-21 ENCOUNTER — Ambulatory Visit (INDEPENDENT_AMBULATORY_CARE_PROVIDER_SITE_OTHER): Payer: 59 | Admitting: Sports Medicine

## 2015-07-21 VITALS — BP 135/78 | HR 66 | Ht 69.0 in | Wt 221.0 lb

## 2015-07-21 DIAGNOSIS — E669 Obesity, unspecified: Secondary | ICD-10-CM

## 2015-07-21 DIAGNOSIS — M25571 Pain in right ankle and joints of right foot: Secondary | ICD-10-CM

## 2015-07-21 MED ORDER — PHENTERMINE HCL 37.5 MG PO TABS: ORAL_TABLET | ORAL | Status: DC

## 2015-07-21 NOTE — Progress Notes (Signed)
  Subjective:    CC: Follow-up  HPI: Obesity: Minimal weight loss after one month on phentermine, did develop some erectile dysfunction.  Right ankle pain: With posttraumatic osteoarthritis, currently going to physical therapy and feeling a bit better.  Past medical history, Surgical history, Family history not pertinant except as noted below, Social history, Allergies, and medications have been entered into the medical record, reviewed, and no changes needed.   Review of Systems: No fevers, chills, night sweats, weight loss, chest pain, or shortness of breath.   Objective:    General: Well Developed, well nourished, and in no acute distress.  Neuro: Alert and oriented x3, extra-ocular muscles intact, sensation grossly intact.  HEENT: Normocephalic, atraumatic, pupils equal round reactive to light, neck supple, no masses, no lymphadenopathy, thyroid nonpalpable.  Skin: Warm and dry, no rashes. Cardiac: Regular rate and rhythm, no murmurs rubs or gallops, no lower extremity edema.  Respiratory: Clear to auscultation bilaterally. Not using accessory muscles, speaking in full sentences.  Impression and Recommendations:

## 2015-07-21 NOTE — Assessment & Plan Note (Signed)
Return for custom orthotics, continue physical therapy. He does have post traumatic degenerative changes in the ankle, he is a candidate for injection.

## 2015-07-21 NOTE — Assessment & Plan Note (Signed)
4 pound weight loss after the first month, he did develop some erectile dysfunction so we discussed when to one half tab twice a day, and then one half tab once a day if persistent symptoms.  Return in one month for a weight check.

## 2015-07-27 ENCOUNTER — Encounter: Payer: Self-pay | Admitting: Sports Medicine

## 2015-07-28 ENCOUNTER — Encounter: Payer: Self-pay | Admitting: Sports Medicine

## 2015-07-29 ENCOUNTER — Encounter: Payer: Self-pay | Admitting: Sports Medicine

## 2015-07-29 ENCOUNTER — Other Ambulatory Visit: Payer: Self-pay | Admitting: Sports Medicine

## 2015-08-05 ENCOUNTER — Encounter: Payer: Self-pay | Admitting: Sports Medicine

## 2015-08-05 ENCOUNTER — Ambulatory Visit (INDEPENDENT_AMBULATORY_CARE_PROVIDER_SITE_OTHER): Payer: 59 | Admitting: Sports Medicine

## 2015-08-05 VITALS — BP 150/102 | HR 65 | Ht 69.0 in | Wt 219.0 lb

## 2015-08-05 DIAGNOSIS — M25571 Pain in right ankle and joints of right foot: Secondary | ICD-10-CM

## 2015-08-05 DIAGNOSIS — E669 Obesity, unspecified: Secondary | ICD-10-CM | POA: Diagnosis not present

## 2015-08-05 MED ORDER — PHENDIMETRAZINE TARTRATE 35 MG PO TABS: ORAL_TABLET | ORAL | Status: DC

## 2015-08-05 NOTE — Progress Notes (Signed)
    Patient was fitted for a : standard, cushioned, semi-rigid orthotic. The orthotic was heated and afterward the patient stood on the orthotic blank positioned on the orthotic stand. The patient was positioned in subtalar neutral position and 10 degrees of ankle dorsiflexion in a weight bearing stance. After completion of molding, a stable base was applied to the orthotic blank. The blank was ground to a stable position for weight bearing. Size: 10 Base: [place], hospital and [place]: None The patient ambulated these, and they were very comfortable.  I spent 40 minutes with this patient, greater than 50% was face-to-face time counseling regarding the below diagnosis, this time was separate from the time spent doing the procedure.  Procedure: Real-time Ultrasound Guided Injection of right tibiotalar joint Device: GE Logiq E  Verbal informed consent obtained.  Time-out conducted.  Noted no overlying erythema, induration, or other signs of local infection.  Skin prepped in a sterile fashion.  Local anesthesia: Topical Ethyl chloride.  With sterile technique and under real time ultrasound guidance:  1 mL kenalog 40, 3 mL lidocaine injected easily into the tibiotalar joint with a 25-gauge needle. Completed without difficulty  Pain immediately resolved suggesting accurate placement of the medication.  Advised to call if fevers/chills, erythema, induration, drainage, or persistent bleeding.  Images permanently stored and available for review in the ultrasound unit.  Impression: Technically successful ultrasound guided injection.

## 2015-08-05 NOTE — Assessment & Plan Note (Signed)
Custom orthotics and intra-articular injection as above. Return in one month.

## 2015-08-05 NOTE — Assessment & Plan Note (Signed)
Slow weight loss, unfortunately also having intolerable erectile dysfunction from phentermine even at one half tab daily. Switching to phendimetrazine, we did discuss Saxenda.

## 2015-08-19 ENCOUNTER — Encounter: Payer: Self-pay | Admitting: Sports Medicine

## 2015-08-19 ENCOUNTER — Ambulatory Visit: Payer: Self-pay | Admitting: Sports Medicine

## 2015-09-16 ENCOUNTER — Encounter: Payer: Self-pay | Admitting: Sports Medicine

## 2015-09-16 ENCOUNTER — Ambulatory Visit (INDEPENDENT_AMBULATORY_CARE_PROVIDER_SITE_OTHER): Payer: 59 | Admitting: Sports Medicine

## 2015-09-16 VITALS — BP 129/83 | HR 74 | Wt 216.0 lb

## 2015-09-16 DIAGNOSIS — E669 Obesity, unspecified: Secondary | ICD-10-CM

## 2015-09-16 DIAGNOSIS — M25571 Pain in right ankle and joints of right foot: Secondary | ICD-10-CM | POA: Diagnosis not present

## 2015-09-16 MED ORDER — PHENDIMETRAZINE TARTRATE ER 105 MG PO CP24: 1.0000 | ORAL_CAPSULE | Freq: Every day | ORAL | Status: DC

## 2015-09-16 MED ORDER — LIRAGLUTIDE -WEIGHT MANAGEMENT 18 MG/3ML ~~LOC~~ SOPN: 3.0000 mg | PEN_INJECTOR | Freq: Every day | SUBCUTANEOUS | Status: DC

## 2015-09-16 NOTE — Assessment & Plan Note (Signed)
3 pound weight loss after one month on phendimetrazine, insufficient weight loss on phentermine. Refilling, as we enter the second month and switching to the extended release formulation. I'm also quite to add Sexson to, he will return for nurse visit to learn how to do injections.

## 2015-09-16 NOTE — Progress Notes (Signed)
  Subjective:    CC: Follow-up  HPI: Obesity: 3 pound weight loss on phendimetrazine, agreeable to switch to an extended release formulation and start Saxenda. He has been in the gym doing significant resistance training that may be interfering with his overall weight loss. We did discuss body composition scan downstairs however he will think about it.  Right ankle osteoarthritis: Resolved after injection.  Past medical history, Surgical history, Family history not pertinant except as noted below, Social history, Allergies, and medications have been entered into the medical record, reviewed, and no changes needed.   Review of Systems: No fevers, chills, night sweats, weight loss, chest pain, or shortness of breath.   Objective:    General: Well Developed, well nourished, and in no acute distress.  Neuro: Alert and oriented x3, extra-ocular muscles intact, sensation grossly intact.  HEENT: Normocephalic, atraumatic, pupils equal round reactive to light, neck supple, no masses, no lymphadenopathy, thyroid nonpalpable.  Skin: Warm and dry, no rashes. Cardiac: Regular rate and rhythm, no murmurs rubs or gallops, no lower extremity edema.  Respiratory: Clear to auscultation bilaterally. Not using accessory muscles, speaking in full sentences. Right Ankle: No visible erythema or swelling. Range of motion is full in all directions. Strength is 5/5 in all directions. Stable lateral and medial ligaments; squeeze test and kleiger test unremarkable; Talar dome nontender; No pain at base of 5th MT; No tenderness over cuboid; No tenderness over N spot or navicular prominence No tenderness on posterior aspects of lateral and medial malleolus No sign of peroneal tendon subluxations; Negative tarsal tunnel tinel's Able to walk 4 steps.  Impression and Recommendations:

## 2015-09-16 NOTE — Assessment & Plan Note (Signed)
Completely resolved after injection last month 

## 2015-09-22 ENCOUNTER — Other Ambulatory Visit: Payer: Self-pay | Admitting: Sports Medicine

## 2015-09-22 DIAGNOSIS — E669 Obesity, unspecified: Secondary | ICD-10-CM

## 2015-09-23 ENCOUNTER — Telehealth: Payer: Self-pay | Admitting: Sports Medicine

## 2015-09-23 NOTE — Telephone Encounter (Signed)
Received fax for prior authorization on Phendimetrazine sent through cover my meds waiting on authorization. - CF

## 2015-09-27 ENCOUNTER — Telehealth: Payer: Self-pay | Admitting: Sports Medicine

## 2015-09-27 ENCOUNTER — Ambulatory Visit: Payer: Self-pay

## 2015-09-27 NOTE — Telephone Encounter (Signed)
Received fax for prior authorization on Saxenda sent through cover my meds waiting on authorization. - CF °

## 2015-09-28 NOTE — Telephone Encounter (Signed)
Received fax from [employer] and they have approved medication from 09/27/2015 - 12/24/2015 or until coverage is no longer available. File ID: [id number] - CF

## 2015-09-28 NOTE — Telephone Encounter (Signed)
I have discussed this with the patient.

## 2015-09-28 NOTE — Telephone Encounter (Signed)
Per mychart messages from patient checking to see what happened with saxenda?

## 2015-10-04 NOTE — Telephone Encounter (Signed)
Received fax from [employer] and they approved authorization on Saxenda from 10/03/2015 - 01/25/2016. File ID [id number]. - CF

## 2015-10-10 ENCOUNTER — Other Ambulatory Visit: Payer: Self-pay | Admitting: Sports Medicine

## 2015-10-11 ENCOUNTER — Other Ambulatory Visit: Payer: Self-pay | Admitting: Sports Medicine

## 2015-10-11 DIAGNOSIS — E669 Obesity, unspecified: Secondary | ICD-10-CM

## 2015-10-11 MED ORDER — LIRAGLUTIDE -WEIGHT MANAGEMENT 18 MG/3ML ~~LOC~~ SOPN: 3.0000 mg | PEN_INJECTOR | Freq: Every day | SUBCUTANEOUS | Status: DC

## 2015-10-13 ENCOUNTER — Ambulatory Visit: Payer: Self-pay | Admitting: Sports Medicine

## 2015-10-18 ENCOUNTER — Ambulatory Visit: Payer: Self-pay | Admitting: Sports Medicine

## 2015-10-27 ENCOUNTER — Ambulatory Visit (INDEPENDENT_AMBULATORY_CARE_PROVIDER_SITE_OTHER): Payer: 59 | Admitting: Sports Medicine

## 2015-10-27 ENCOUNTER — Encounter: Payer: Self-pay | Admitting: Sports Medicine

## 2015-10-27 VITALS — BP 128/86 | HR 71 | Temp 98.0°F | Resp 18 | Wt 211.3 lb

## 2015-10-27 DIAGNOSIS — E669 Obesity, unspecified: Secondary | ICD-10-CM | POA: Diagnosis not present

## 2015-10-27 DIAGNOSIS — M25571 Pain in right ankle and joints of right foot: Secondary | ICD-10-CM | POA: Diagnosis not present

## 2015-10-27 MED ORDER — PHENDIMETRAZINE TARTRATE ER 105 MG PO CP24: 1.0000 | ORAL_CAPSULE | Freq: Every day | ORAL | Status: DC

## 2015-10-27 NOTE — Assessment & Plan Note (Signed)
Last injection was 2 months ago, he does have custom orthotics, developing a recurrent pain, we will wait at least another month or 2 before considering another injection, weight loss will also help his ankle osteoarthritis.

## 2015-10-27 NOTE — Progress Notes (Signed)
  Subjective:    CC: Follow-up  HPI: Obesity: Minimal weight loss on phendimetrazine, we added Saxenda at the last visit, he is only 0.6 mg but has lost 5 pounds already. No nausea. He does have some questions on how to do his injection.  Right ankle osteoarthritis: Last injection was 2 months ago, he is done well with this and custom orthotics, he is rate for another injection but understands it's too early right now.  Past medical history, Surgical history, Family history not pertinant except as noted below, Social history, Allergies, and medications have been entered into the medical record, reviewed, and no changes needed.   Review of Systems: No fevers, chills, night sweats, weight loss, chest pain, or shortness of breath.   Objective:    General: Well Developed, well nourished, and in no acute distress.  Neuro: Alert and oriented x3, extra-ocular muscles intact, sensation grossly intact.  HEENT: Normocephalic, atraumatic, pupils equal round reactive to light, neck supple, no masses, no lymphadenopathy, thyroid nonpalpable.  Skin: Warm and dry, no rashes. Cardiac: Regular rate and rhythm, no murmurs rubs or gallops, no lower extremity edema.  Respiratory: Clear to auscultation bilaterally. Not using accessory muscles, speaking in full sentences.  We walked the patient through the injection procedure.  Impression and Recommendations:   I spent 25 minutes with this patient, greater than 50% was face-to-face time counseling regarding the above diagnoses

## 2015-10-27 NOTE — Assessment & Plan Note (Signed)
Additional 5 pound weight loss since starting Saxenda, continued with phendimetrazine. Refilling phendimetrazine, continue Saxenda, we coached him through his second shot today, this time at 1.2 mg.

## 2015-11-29 ENCOUNTER — Ambulatory Visit: Payer: Self-pay | Admitting: Sports Medicine

## 2015-12-19 ENCOUNTER — Ambulatory Visit (INDEPENDENT_AMBULATORY_CARE_PROVIDER_SITE_OTHER): Payer: 59 | Admitting: Sports Medicine

## 2015-12-19 ENCOUNTER — Encounter: Payer: Self-pay | Admitting: Sports Medicine

## 2015-12-19 VITALS — BP 131/86 | HR 84 | Resp 18 | Wt 210.0 lb

## 2015-12-19 DIAGNOSIS — M25571 Pain in right ankle and joints of right foot: Secondary | ICD-10-CM | POA: Diagnosis not present

## 2015-12-19 DIAGNOSIS — E669 Obesity, unspecified: Secondary | ICD-10-CM

## 2015-12-19 DIAGNOSIS — B353 Tinea pedis: Secondary | ICD-10-CM

## 2015-12-19 MED ORDER — CLOTRIMAZOLE-BETAMETHASONE 1-0.05 % EX CREA: 1.0000 "application " | TOPICAL_CREAM | Freq: Two times a day (BID) | CUTANEOUS | Status: DC

## 2015-12-19 MED ORDER — EXENATIDE ER 2 MG ~~LOC~~ PEN: 2.0000 mg | PEN_INJECTOR | SUBCUTANEOUS | Status: DC

## 2015-12-19 NOTE — Assessment & Plan Note (Signed)
Adding Lotrisone. 

## 2015-12-19 NOTE — Assessment & Plan Note (Signed)
Previous ankle injection was 4-5 months ago.  Repeat right ankle injection today.

## 2015-12-19 NOTE — Progress Notes (Signed)
  Subjective:    CC: Follow-up  HPI: Obesity: 1 pound weight loss, unfortunately unable to afford Saxenda, with the new year it has increased significantly in [place].  Right ankle osteoarthritis, posttraumatic: 4-5 months after his last injection, desires repeat interventional treatment today.  Skin rash: Left foot, minimally pruritic, scaly on the inside and plantar aspect of the foot.  Past medical history, Surgical history, Family history not pertinant except as noted below, Social history, Allergies, and medications have been entered into the medical record, reviewed, and no changes needed.   Review of Systems: No fevers, chills, night sweats, weight loss, chest pain, or shortness of breath.   Objective:    General: Well Developed, well nourished, and in no acute distress.  Neuro: Alert and oriented x3, extra-ocular muscles intact, sensation grossly intact.  HEENT: Normocephalic, atraumatic, pupils equal round reactive to light, neck supple, no masses, no lymphadenopathy, thyroid nonpalpable.  Skin: Warm and dry, there are scaly, circular lesions on his left foot. Cardiac: Regular rate and rhythm, no murmurs rubs or gallops, no lower extremity edema.  Respiratory: Clear to auscultation bilaterally. Not using accessory muscles, speaking in full sentences.  Procedure: Real-time Ultrasound Guided Injection of right ankle Device: GE Logiq E  Verbal informed consent obtained.  Time-out conducted.  Noted no overlying erythema, induration, or other signs of local infection.  Skin prepped in a sterile fashion.  Local anesthesia: Topical Ethyl chloride.  With sterile technique and under real time ultrasound guidance:  2 mL lidocaine, 1 mL kenalog 40 injected easily into the ankle joint. Completed without difficulty  Pain immediately resolved suggesting accurate placement of the medication.  Advised to call if fevers/chills, erythema, induration, drainage, or persistent bleeding.  Images  permanently stored and available for review in the ultrasound unit.  Impression: Technically successful ultrasound guided injection.  Impression and Recommendations:

## 2015-12-19 NOTE — Addendum Note (Signed)
Addended by: Baird Kay on: 12/19/2015 09:26 AM   Modules accepted: Orders, Medications

## 2015-12-19 NOTE — Assessment & Plan Note (Signed)
For some reason Jonathon Patterson is now too expensive, switching to Bydureon.

## 2016-01-30 ENCOUNTER — Encounter: Payer: Self-pay | Admitting: Sports Medicine

## 2016-02-17 ENCOUNTER — Other Ambulatory Visit: Payer: Self-pay | Admitting: Sports Medicine

## 2016-02-23 ENCOUNTER — Encounter: Payer: Self-pay | Admitting: Sports Medicine

## 2016-02-27 MED ORDER — PREDNISONE 50 MG PO TABS: 50.0000 mg | ORAL_TABLET | Freq: Every day | ORAL | Status: DC

## 2016-03-19 ENCOUNTER — Ambulatory Visit (INDEPENDENT_AMBULATORY_CARE_PROVIDER_SITE_OTHER): Payer: 59 | Admitting: Sports Medicine

## 2016-03-19 ENCOUNTER — Ambulatory Visit (INDEPENDENT_AMBULATORY_CARE_PROVIDER_SITE_OTHER): Payer: 59

## 2016-03-19 ENCOUNTER — Encounter: Payer: Self-pay | Admitting: Sports Medicine

## 2016-03-19 VITALS — BP 136/82 | HR 64 | Resp 18 | Wt 223.0 lb

## 2016-03-19 DIAGNOSIS — E669 Obesity, unspecified: Secondary | ICD-10-CM

## 2016-03-19 DIAGNOSIS — M2242 Chondromalacia patellae, left knee: Secondary | ICD-10-CM | POA: Diagnosis not present

## 2016-03-19 DIAGNOSIS — M25562 Pain in left knee: Secondary | ICD-10-CM

## 2016-03-19 DIAGNOSIS — M25561 Pain in right knee: Secondary | ICD-10-CM

## 2016-03-19 MED ORDER — NALTREXONE-BUPROPION HCL ER 8-90 MG PO TB12: ORAL_TABLET | ORAL | Status: DC

## 2016-03-19 NOTE — Assessment & Plan Note (Signed)
Switching to Contrave.

## 2016-03-19 NOTE — Progress Notes (Signed)
  Subjective:    CC: follow-up  HPI: Left knee pain: Localized under the patella, worse with squatting, going up and down stairs. Pain is moderate, persistent without radiation, or mechanical symptoms.  Obesity: Desires to try different medication. Was unable to afford Bydureon or Saxenda.  Past medical history, Surgical history, Family history not pertinant except as noted below, Social history, Allergies, and medications have been entered into the medical record, reviewed, and no changes needed.   Review of Systems: No fevers, chills, night sweats, weight loss, chest pain, or shortness of breath.   Objective:    General: Well Developed, well nourished, and in no acute distress.  Neuro: Alert and oriented x3, extra-ocular muscles intact, sensation grossly intact.  HEENT: Normocephalic, atraumatic, pupils equal round reactive to light, neck supple, no masses, no lymphadenopathy, thyroid nonpalpable.  Skin: Warm and dry, no rashes. Cardiac: Regular rate and rhythm, no murmurs rubs or gallops, no lower extremity edema.  Respiratory: Clear to auscultation bilaterally. Not using accessory muscles, speaking in full sentences. Left Knee: Normal to inspection with no erythema or effusion or obvious bony abnormalities. Tender to palpation under the medial and lateral patellar facets ROM normal in flexion and extension and lower leg rotation. Ligaments with solid consistent endpoints including ACL, PCL, LCL, MCL. Negative Mcmurray's and provocative meniscal tests. Non painful patellar compression. Patellar and quadriceps tendons unremarkable. Hamstring and quadriceps strength is normal.  Impression and Recommendations:

## 2016-03-19 NOTE — Assessment & Plan Note (Signed)
X-rays, physical therapy. Next line return 6 weeks, injection if no better.

## 2016-03-23 ENCOUNTER — Telehealth: Payer: Self-pay | Admitting: *Deleted

## 2016-03-23 NOTE — Telephone Encounter (Signed)
PA initiated through [employer] [id number] - PA Case ID: [id number] - Rx #: [id number]

## 2016-03-26 NOTE — Telephone Encounter (Signed)
Shona Simpsoncontrave has been approved through insurance until  07/24/2016. Pt notified

## 2016-04-30 ENCOUNTER — Ambulatory Visit (INDEPENDENT_AMBULATORY_CARE_PROVIDER_SITE_OTHER): Payer: 59 | Admitting: Sports Medicine

## 2016-04-30 ENCOUNTER — Encounter: Payer: Self-pay | Admitting: Sports Medicine

## 2016-04-30 VITALS — BP 143/84 | HR 66 | Wt 217.0 lb

## 2016-04-30 DIAGNOSIS — E669 Obesity, unspecified: Secondary | ICD-10-CM

## 2016-04-30 DIAGNOSIS — M19071 Primary osteoarthritis, right ankle and foot: Secondary | ICD-10-CM | POA: Diagnosis not present

## 2016-04-30 DIAGNOSIS — B353 Tinea pedis: Secondary | ICD-10-CM

## 2016-04-30 MED ORDER — BUPROPION HCL ER (XL) 150 MG PO TB24: ORAL_TABLET | ORAL | Status: DC

## 2016-04-30 MED ORDER — CLOTRIMAZOLE-BETAMETHASONE 1-0.05 % EX CREA: 1.0000 "application " | TOPICAL_CREAM | Freq: Two times a day (BID) | CUTANEOUS | Status: DC

## 2016-04-30 MED ORDER — NALTREXONE HCL 50 MG PO TABS: ORAL_TABLET | ORAL | Status: DC

## 2016-04-30 NOTE — Assessment & Plan Note (Signed)
Four-month response to previous injection, repeat ankle injection today, return as needed.

## 2016-04-30 NOTE — Progress Notes (Signed)
  Subjective:    CC: Follow-up  HPI: Obesity: Good weight loss on Contrave, it is too expensive.  Primary osteoarthritis right ankle: Last injection was 4 months ago, desires repeat today. Does have some posterior ankle joint line pain. Unsure as to whether this improves after injections. Pain is moderate, persistent without radiation.  Past medical history, Surgical history, Family history not pertinant except as noted below, Social history, Allergies, and medications have been entered into the medical record, reviewed, and no changes needed.   Review of Systems: No fevers, chills, night sweats, weight loss, chest pain, or shortness of breath.   Objective:    General: Well Developed, well nourished, and in no acute distress.  Neuro: Alert and oriented x3, extra-ocular muscles intact, sensation grossly intact.  HEENT: Normocephalic, atraumatic, pupils equal round reactive to light, neck supple, no masses, no lymphadenopathy, thyroid nonpalpable.  Skin: Warm and dry, no rashes. Cardiac: Regular rate and rhythm, no murmurs rubs or gallops, no lower extremity edema.  Respiratory: Clear to auscultation bilaterally. Not using accessory muscles, speaking in full sentences.  Procedure: Real-time Ultrasound Guided Injection of right ankle Device: GE Logiq E  Verbal informed consent obtained.  Time-out conducted.  Noted no overlying erythema, induration, or other signs of local infection.  Skin prepped in a sterile fashion.  Local anesthesia: Topical Ethyl chloride.  With sterile technique and under real time ultrasound guidance:  25-gauge needle advanced into the joint and 1 mL kenalog 40, 1 mL lidocaine, 1 mL Marcaine injected easily. Completed without difficulty  Pain immediately resolved suggesting accurate placement of the medication.  Advised to call if fevers/chills, erythema, induration, drainage, or persistent bleeding.  Images permanently stored and available for review in the  ultrasound unit.  Impression: Technically successful ultrasound guided injection.  Impression and Recommendations:

## 2016-04-30 NOTE — Assessment & Plan Note (Signed)
Good weight loss on the initial month of Contrave however too expensive, switching to separate generics. Return in 3 months.

## 2016-05-01 ENCOUNTER — Encounter: Payer: Self-pay | Admitting: Sports Medicine

## 2016-07-18 ENCOUNTER — Other Ambulatory Visit: Payer: Self-pay | Admitting: Sports Medicine

## 2016-07-18 DIAGNOSIS — B353 Tinea pedis: Secondary | ICD-10-CM

## 2016-07-19 MED ORDER — CLOTRIMAZOLE-BETAMETHASONE 1-0.05 % EX CREA: 1.0000 "application " | TOPICAL_CREAM | Freq: Two times a day (BID) | CUTANEOUS | 1 refills | Status: AC

## 2016-07-20 ENCOUNTER — Encounter: Payer: Self-pay | Admitting: Sports Medicine

## 2016-07-23 MED ORDER — TRAMADOL HCL 50 MG PO TABS: ORAL_TABLET | ORAL | 0 refills | Status: DC

## 2016-07-31 ENCOUNTER — Ambulatory Visit: Payer: Self-pay | Admitting: Sports Medicine

## 2016-09-28 ENCOUNTER — Ambulatory Visit (INDEPENDENT_AMBULATORY_CARE_PROVIDER_SITE_OTHER): Payer: 59 | Admitting: Sports Medicine

## 2016-09-28 ENCOUNTER — Encounter: Payer: Self-pay | Admitting: Sports Medicine

## 2016-09-28 DIAGNOSIS — M19071 Primary osteoarthritis, right ankle and foot: Secondary | ICD-10-CM | POA: Diagnosis not present

## 2016-09-28 DIAGNOSIS — E669 Obesity, unspecified: Secondary | ICD-10-CM | POA: Diagnosis not present

## 2016-09-28 MED ORDER — LIRAGLUTIDE -WEIGHT MANAGEMENT 18 MG/3ML ~~LOC~~ SOPN: 3.0000 mg | PEN_INJECTOR | Freq: Every day | SUBCUTANEOUS | 0 refills | Status: DC

## 2016-09-28 NOTE — Progress Notes (Signed)
  Subjective:    CC:  Follow-up  HPI: Obesity: Has really not lost any weight, we were unable to afford Contrave, and he didn't lose weight on separate generics of Wellbutrin and naltrexone. He did have some good weight loss with Saxenda and desires to try this again. He is already failed other weight loss medications including phentermine.  Ankle pain: Primary posterior medical osteoarthritis, previous injection was 5 months ago, desires repeat injection, pain is moderate, persistent, localized over the tibiotalar joint.  Past medical history:  Negative.  See flowsheet/record as well for more information.  Surgical history: Negative.  See flowsheet/record as well for more information.  Family history: Negative.  See flowsheet/record as well for more information.  Social history: Negative.  See flowsheet/record as well for more information.  Allergies, and medications have been entered into the medical record, reviewed, and no changes needed.   Review of Systems: No fevers, chills, night sweats, weight loss, chest pain, or shortness of breath.   Objective:    General: Well Developed, well nourished, and in no acute distress.  Neuro: Alert and oriented x3, extra-ocular muscles intact, sensation grossly intact.  HEENT: Normocephalic, atraumatic, pupils equal round reactive to light, neck supple, no masses, no lymphadenopathy, thyroid nonpalpable.  Skin: Warm and dry, no rashes. Cardiac: Regular rate and rhythm, no murmurs rubs or gallops, no lower extremity edema.  Respiratory: Clear to auscultation bilaterally. Not using accessory muscles, speaking in full sentences.  Procedure: Real-time Ultrasound Guided Injection of right tibiotalar joint Device: GE Logiq E  Verbal informed consent obtained.  Time-out conducted.  Noted no overlying erythema, induration, or other signs of local infection.  Skin prepped in a sterile fashion.  Local anesthesia: Topical Ethyl chloride.  With sterile  technique and under real time ultrasound guidance:  Using a 25-gauge needle and injected 1 mL kenalog 40, 1 mL lidocaine, 1 mL Marcaine. Completed without difficulty  Pain immediately resolved suggesting accurate placement of the medication.  Advised to call if fevers/chills, erythema, induration, drainage, or persistent bleeding.  Images permanently stored and available for review in the ultrasound unit.  Impression: Technically successful ultrasound guided injection.  Impression and Recommendations:    Primary osteoarthritis of right ankle 5 month response to previous injection, repeat right ankle injection.  Obesity (BMI 30-39.9) Contrave was too expensive, Wellbutrin and naltrexone separately did not have affect. Switching back to [place].

## 2016-09-28 NOTE — Assessment & Plan Note (Signed)
Contrave was too expensive, Wellbutrin and naltrexone separately did not have affect. Switching back to [place].

## 2016-09-28 NOTE — Assessment & Plan Note (Signed)
5 month response to previous injection, repeat right ankle injection.

## 2016-10-01 ENCOUNTER — Telehealth: Payer: Self-pay | Admitting: *Deleted

## 2016-10-01 NOTE — Telephone Encounter (Signed)
PA submitted for Saxenda NJFVJA - PA Case ID: [id number]

## 2016-10-08 ENCOUNTER — Other Ambulatory Visit: Payer: Self-pay | Admitting: Sports Medicine

## 2016-10-08 DIAGNOSIS — E669 Obesity, unspecified: Secondary | ICD-10-CM

## 2016-10-11 NOTE — Telephone Encounter (Signed)
Called insurance to f/u status , found that saxenda was denied because patient has not lost 5% of body weight since he initially started saxenda.(was noted that he started saxenda last year and tried it for about 1 month  then was unable to afford and tried other meds but as of last visit didn't lose much weight)

## 2016-10-11 NOTE — Telephone Encounter (Signed)
He hasn't used it consistently. We need to try to get this reapproved, the issue is they don't know he hasn't tried consistently and you're dealing with some Diplomatic Services operational officersecretary.  If possible get one of their doctors or appeal specialists on the phone to speak to me.

## 2016-10-12 NOTE — Telephone Encounter (Addendum)
The insurance company is aware that he tried it for only one month as I made them aware of this yesterday when I called after receiving the denial.The rep could see the date when the patient initially tried this medication and see that there were no other paid claims for saxenda. Also this authorization was done as an initial authorization and not a reauthorization so I didn't even understand why the question was asked whether or not he lost 5 % of his body weight. He was unable to afford the medication after the first month. If you would like to expedite an appeal you can call number on the form [phone]

## 2016-10-24 IMAGING — DX DG KNEE COMPLETE 4+V*L*
4 series · 4 of 4 positions shown · non-contrast
Comparison: None

CLINICAL DATA: LEFT anterior patellar pain for several months when
stepping down, no known injury

EXAM:
LEFT KNEE - COMPLETE 4+ VIEW

[tunnel]
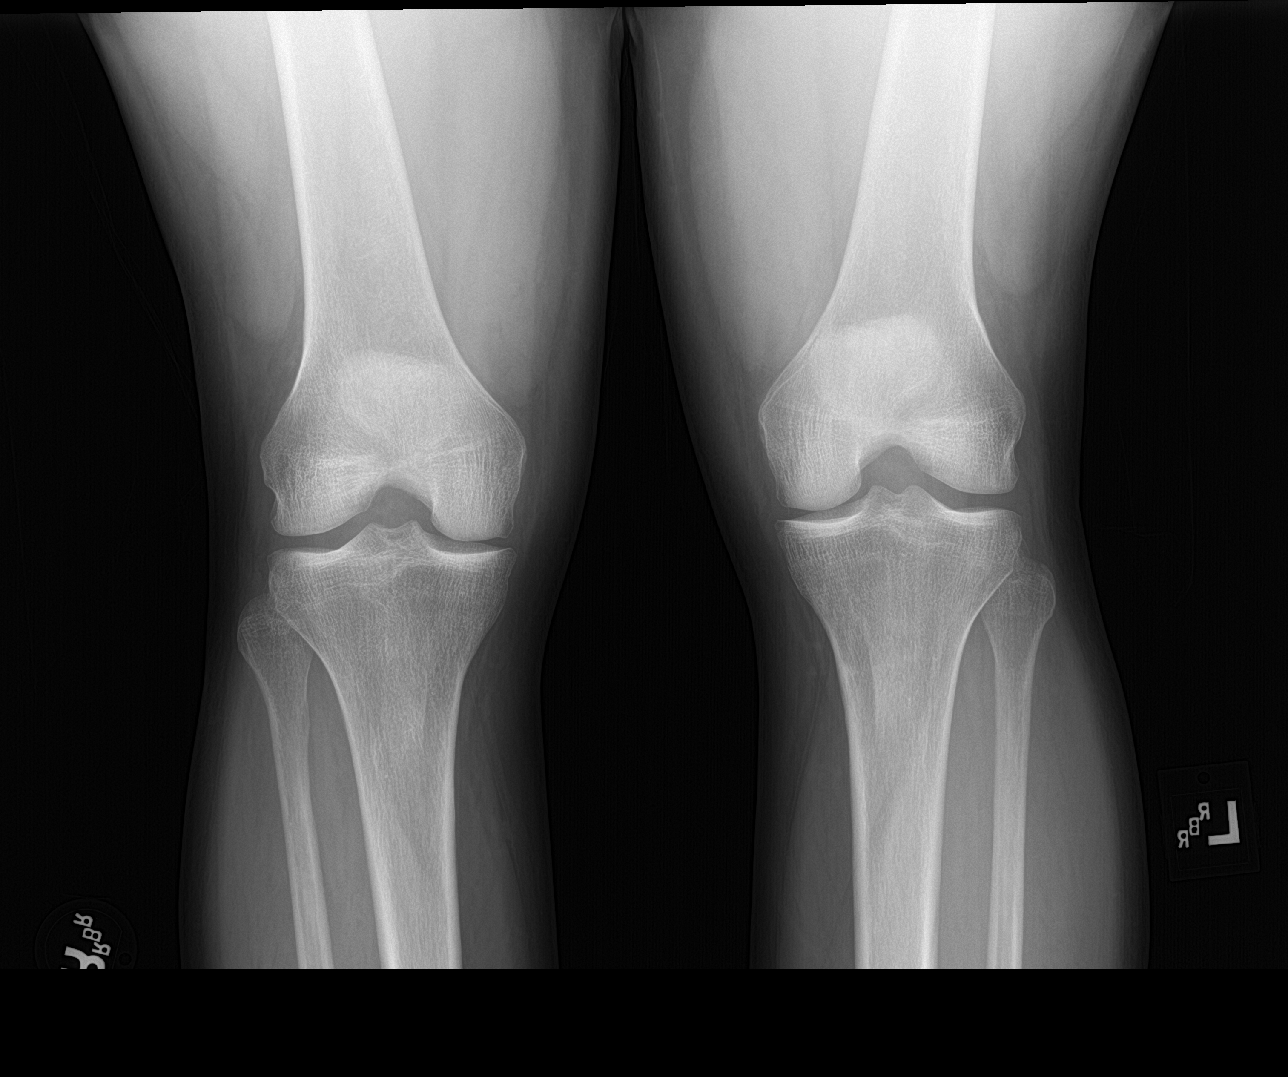

[knee lat]
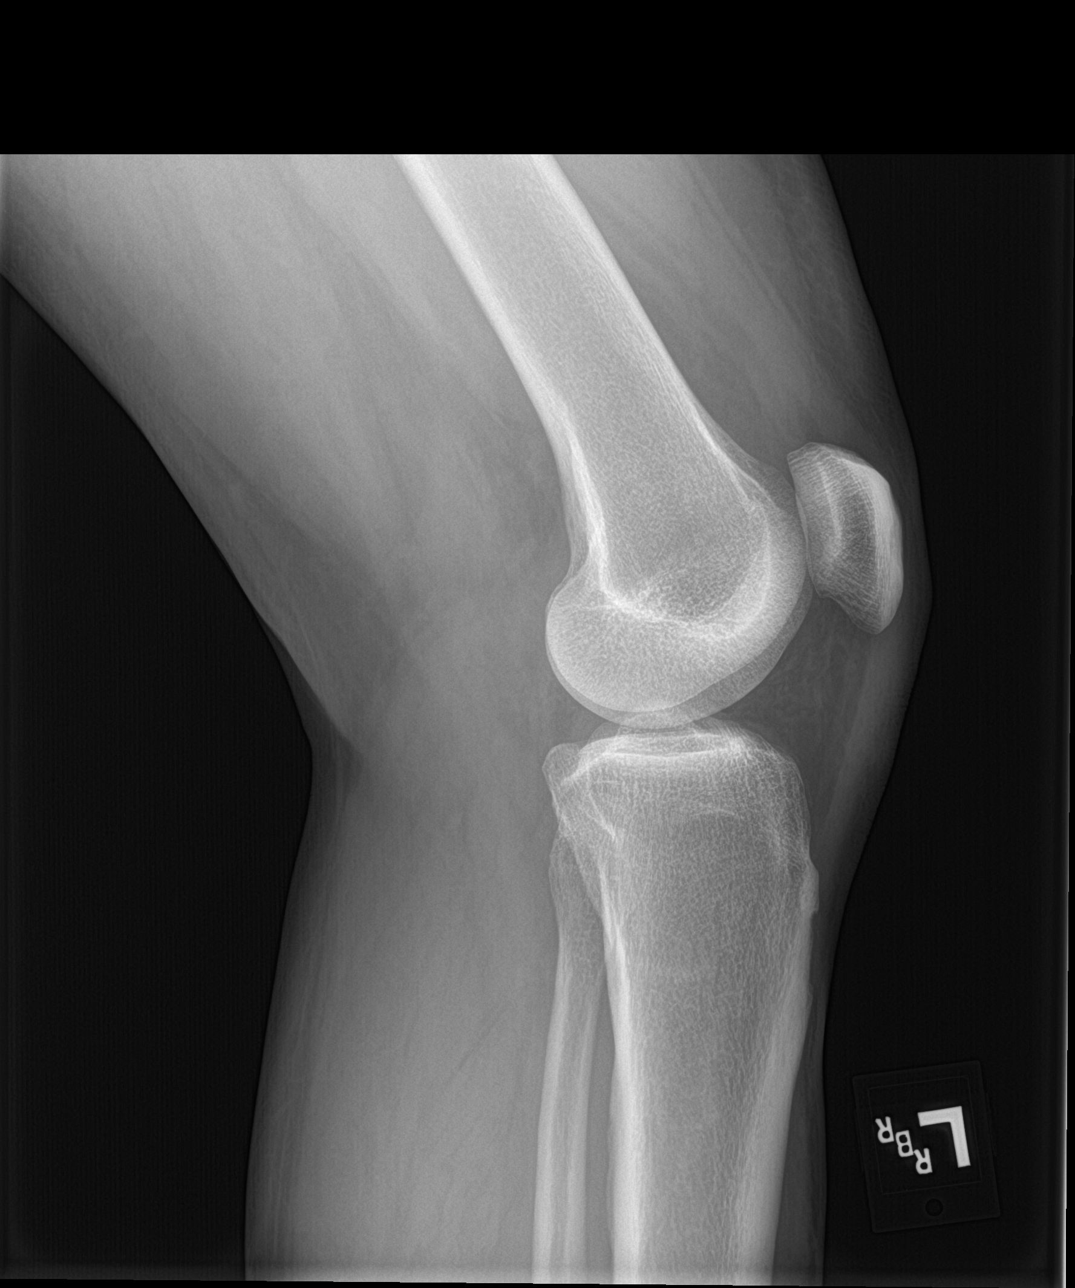

[knee sunrise]
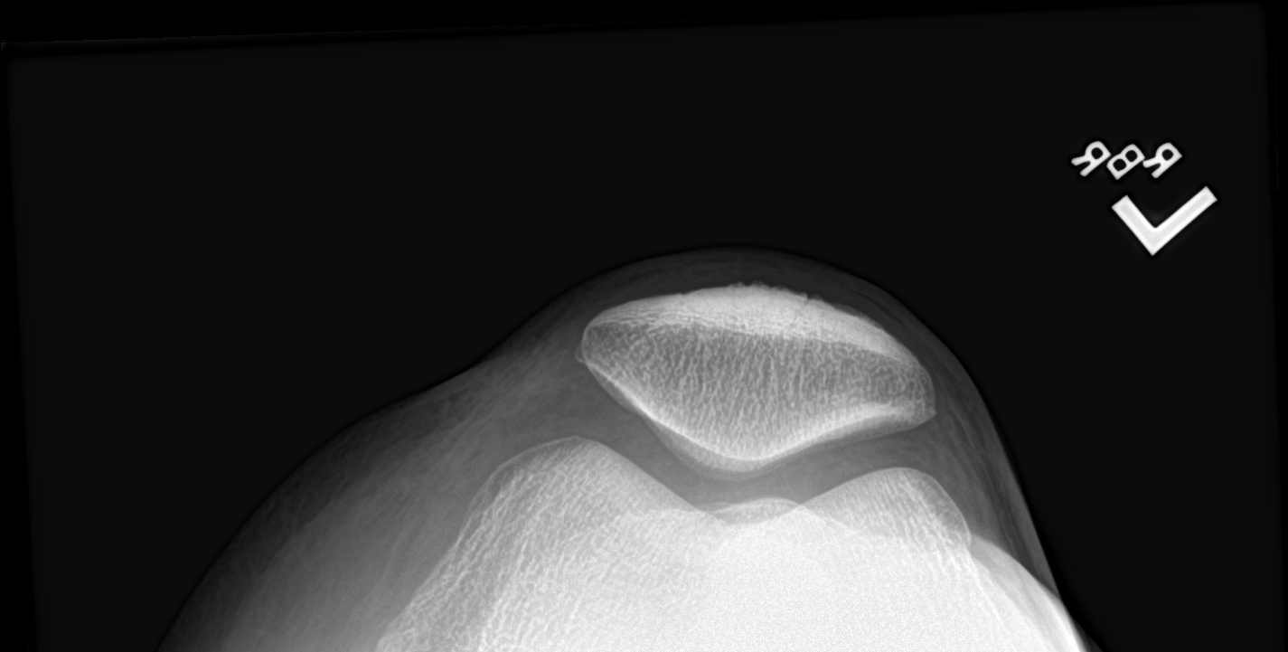

[knee ap bilat standing]
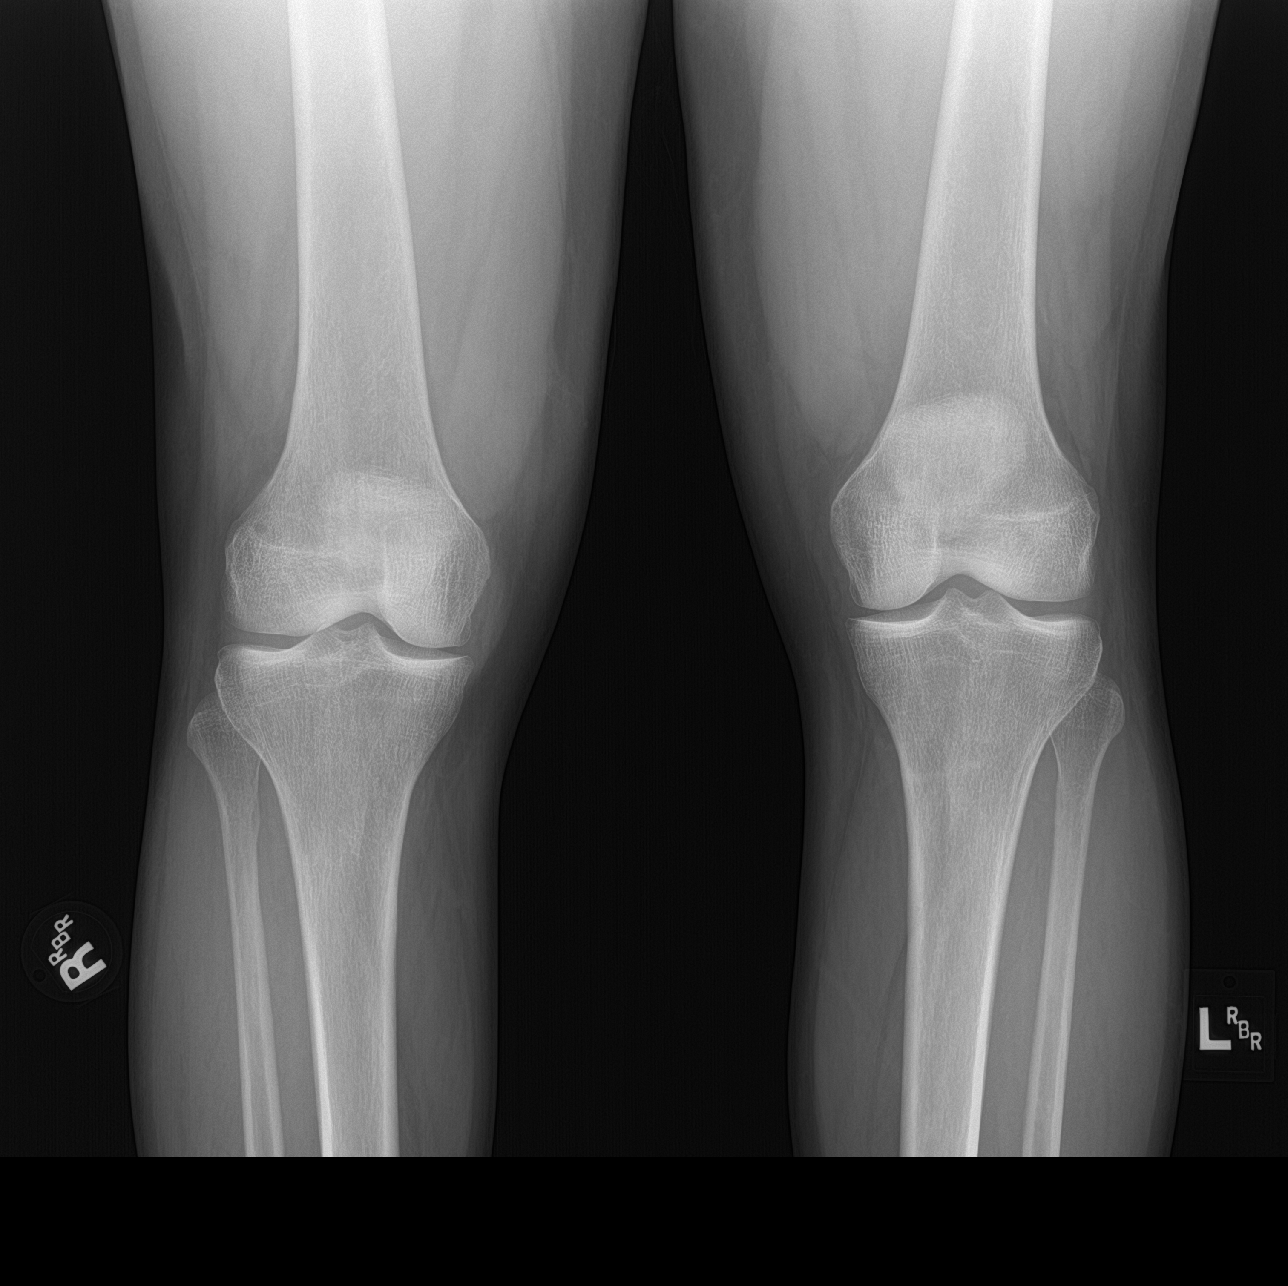

[4 of 4 positions shown; findings below may reference images not displayed]

FINDINGS: Osseous mineralization normal.

Minimal narrowing of medial compartment LEFT knee.

Lateral compartment and patellofemoral joint spaces appear
preserved.

No acute fracture, dislocation or bone destruction.

No knee joint effusion or regional soft tissue abnormalities.
IMPRESSION: Minimal degenerative changes medial compartment LEFT knee.

## 2016-10-26 ENCOUNTER — Ambulatory Visit: Payer: Self-pay | Admitting: Sports Medicine

## 2016-10-30 ENCOUNTER — Telehealth: Payer: Self-pay | Admitting: Sports Medicine

## 2016-10-30 NOTE — Telephone Encounter (Signed)
I sent prior authorization for Saxenda through cover my meds waiting on authorization - CF

## 2016-11-02 NOTE — Telephone Encounter (Signed)
Received fax from [facility] and they cancelled request for Saxenda due to it was denied on 10/08/16 - CF

## 2016-11-19 ENCOUNTER — Encounter: Payer: Self-pay | Admitting: Sports Medicine

## 2016-11-19 ENCOUNTER — Ambulatory Visit (INDEPENDENT_AMBULATORY_CARE_PROVIDER_SITE_OTHER): Payer: 59 | Admitting: Sports Medicine

## 2016-11-19 DIAGNOSIS — M2242 Chondromalacia patellae, left knee: Secondary | ICD-10-CM | POA: Diagnosis not present

## 2016-11-19 DIAGNOSIS — E669 Obesity, unspecified: Secondary | ICD-10-CM | POA: Diagnosis not present

## 2016-11-19 MED ORDER — LORCASERIN HCL ER 20 MG PO TB24: 1.0000 | ORAL_TABLET | Freq: Every day | ORAL | 3 refills | Status: DC

## 2016-11-19 MED ORDER — MELOXICAM 15 MG PO TABS
ORAL_TABLET | ORAL | 3 refills | Status: DC
Start: 2016-11-19 — End: 2021-07-18

## 2016-11-19 NOTE — Assessment & Plan Note (Signed)
Starting meloxicam. Home rehabilitation exercises. X-rays did show mild osteoarthritis.  Return in one month, injection if no better.

## 2016-11-19 NOTE — Assessment & Plan Note (Signed)
Contrave was too expensive, Saxenda not covered, Wellbutrin and naltrexone separately did not work, phentermine and phendimetrazine were ineffective. At this point switching to Belviq. He will let me know if this is covered and not. Ultimately if this doesn't work it is simply going to be diet and exercise without pharmacologic intervention.

## 2016-11-19 NOTE — Progress Notes (Signed)
  Subjective:    CC: Follow-up  HPI: At this point we have been very unsuccessful with gaining weight loss medications approved, we have not yet tried Belviq.  Left knee pain: Patellofemoral, x-ray showed moderate osteoarthritis. Not taking any meloxicam, has not done the exercises.  Past medical history:  Negative.  See flowsheet/record as well for more information.  Surgical history: Negative.  See flowsheet/record as well for more information.  Family history: Negative.  See flowsheet/record as well for more information.  Social history: Negative.  See flowsheet/record as well for more information.  Allergies, and medications have been entered into the medical record, reviewed, and no changes needed.   Review of Systems: No fevers, chills, night sweats, weight loss, chest pain, or shortness of breath.   Objective:    General: Well Developed, well nourished, and in no acute distress.  Neuro: Alert and oriented x3, extra-ocular muscles intact, sensation grossly intact.  HEENT: Normocephalic, atraumatic, pupils equal round reactive to light, neck supple, no masses, no lymphadenopathy, thyroid nonpalpable.  Skin: Warm and dry, no rashes. Cardiac: Regular rate and rhythm, no murmurs rubs or gallops, no lower extremity edema.  Respiratory: Clear to auscultation bilaterally. Not using accessory muscles, speaking in full sentences. Left Knee: Normal to inspection with no erythema or effusion or obvious bony abnormalities. Tender to palpation at the patellar facets ROM normal in flexion and extension and lower leg rotation. Ligaments with solid consistent endpoints including ACL, PCL, LCL, MCL. Negative Mcmurray's and provocative meniscal tests. Non painful patellar compression. Patellar and quadriceps tendons unremarkable. Hamstring and quadriceps strength is normal.  Impression and Recommendations:    Chondromalacia of left patellofemoral joint Starting meloxicam. Home  rehabilitation exercises. X-rays did show mild osteoarthritis.  Return in one month, injection if no better.  Obesity (BMI 30-39.9) Contrave was too expensive, Saxenda not covered, Wellbutrin and naltrexone separately did not work, phentermine and phendimetrazine were ineffective. At this point switching to Belviq. He will let me know if this is covered and not. Ultimately if this doesn't work it is simply going to be diet and exercise without pharmacologic intervention.  I spent 25 minutes with this patient, greater than 50% was face-to-face time counseling regarding the above diagnoses

## 2016-11-20 ENCOUNTER — Telehealth: Payer: Self-pay | Admitting: *Deleted

## 2016-11-20 NOTE — Telephone Encounter (Signed)
PA submitted for Belviq through covermymeds Key: [id number] - PA Case ID: [id number]

## 2016-12-11 NOTE — Telephone Encounter (Signed)
The medication has been approved for coverage un 02/18/2017.patient aware

## 2016-12-18 ENCOUNTER — Encounter: Payer: Self-pay | Admitting: Sports Medicine

## 2016-12-18 ENCOUNTER — Ambulatory Visit: Payer: Self-pay | Admitting: Sports Medicine

## 2017-12-16 ENCOUNTER — Other Ambulatory Visit: Payer: Self-pay | Admitting: Sports Medicine

## 2017-12-16 DIAGNOSIS — B353 Tinea pedis: Secondary | ICD-10-CM

## 2019-02-03 ENCOUNTER — Ambulatory Visit (HOSPITAL_COMMUNITY): Payer: Self-pay | Admitting: Psychiatry

## 2019-02-16 ENCOUNTER — Ambulatory Visit (HOSPITAL_COMMUNITY): Payer: Self-pay | Admitting: Psychiatry

## 2019-03-02 ENCOUNTER — Telehealth (HOSPITAL_COMMUNITY): Payer: Self-pay

## 2019-03-02 ENCOUNTER — Other Ambulatory Visit (HOSPITAL_COMMUNITY): Payer: Self-pay

## 2019-03-02 ENCOUNTER — Other Ambulatory Visit (HOSPITAL_COMMUNITY): Payer: Self-pay | Admitting: Psychiatry

## 2019-03-02 ENCOUNTER — Encounter (HOSPITAL_COMMUNITY): Payer: Self-pay | Admitting: Psychiatry

## 2019-03-02 ENCOUNTER — Ambulatory Visit (INDEPENDENT_AMBULATORY_CARE_PROVIDER_SITE_OTHER): Payer: No Typology Code available for payment source | Admitting: Psychiatry

## 2019-03-02 ENCOUNTER — Other Ambulatory Visit: Payer: Self-pay

## 2019-03-02 DIAGNOSIS — F4310 Post-traumatic stress disorder, unspecified: Secondary | ICD-10-CM | POA: Diagnosis not present

## 2019-03-02 DIAGNOSIS — F909 Attention-deficit hyperactivity disorder, unspecified type: Secondary | ICD-10-CM | POA: Diagnosis not present

## 2019-03-02 DIAGNOSIS — F1010 Alcohol abuse, uncomplicated: Secondary | ICD-10-CM

## 2019-03-02 MED ORDER — ESZOPICLONE 2 MG PO TABS: 2.0000 mg | ORAL_TABLET | Freq: Every evening | ORAL | 0 refills | Status: DC | PRN

## 2019-03-02 MED ORDER — AMPHETAMINE-DEXTROAMPHET ER 30 MG PO CP24: 30.0000 mg | ORAL_CAPSULE | Freq: Every day | ORAL | 0 refills | Status: DC

## 2019-03-02 MED ORDER — PRAZOSIN HCL 1 MG PO CAPS: 1.0000 mg | ORAL_CAPSULE | Freq: Every day | ORAL | 0 refills | Status: DC

## 2019-03-02 NOTE — Telephone Encounter (Signed)
Patient called and would like his Adderall and his Lunesta sent to [facility], it is over a hundreds dollars cheaper there. I have already put the pharmacy in patients chart

## 2019-03-02 NOTE — Progress Notes (Signed)
Psychiatric Initial Adult Assessment   Patient Identification: Jonathon Patterson MRN:  [id number] Date of Evaluation:  03/02/2019 Referral Source: T.Thekkekandam MD Chief Complaint:  Difficulty focusing, poor memory, irritability Visit Diagnosis:    ICD-10-CM   1. PTSD (post-traumatic stress disorder) F43.10   2. Adult ADHD F90.9   3. Alcohol use disorder, mild, abuse F10.10    Interview was conducted via telephone and I verified that I was speaking with the correct person using two identifiers. I discussed the limitations of evaluation and management by telemedicine and  the availability of in person appointments. Patient expressed understanding and agreed to proceed.  History of Present Illness:  48 yo AAM presents to establish regular mental health follow up both for medication management and counseling. His previous providers are not covered by his insurance. 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 He has been in psychotherapy for that and would like to continue in our practice. He denies feeling particularly depressed  ("Not more than usual") hopeless or suicidal. He admits to past episodes pf passive SI w/o plan or intent. He has never been psychiatrically hospitalized. There is no hx of clear manic episodes.  He does struggle with focusing/conectration, task completion and working memory. He was previously treated at [facility] system for ADHD. He was prescribed Vyvanse and then dexedrine. He remembers clearly benefiting from the latter. Interestingly enough he does not recall having many problems with focusing when in elementary school. He also was at one time on escitalopram for PTSD but does not remember it being very helpful. He has difficulty with middle insomnia - wakes up after 3-4 hours. Hr does not recall nightmares but his girlfriend would tell him  that he often yells and restless at night. He admits to being easily frustrated, angered but cannot describe any clear manic episodes. Occasional nighttime hallucinations possibly related to combat trauma.  Patient has along (over 20 years) hx of alcohol abuse. He has a remote (over 10 years ago) hx of 3 DUIs. He still would drinh 4-5 drinks daily. He denies ever experiencing shakes or any other symptoms of alcohol withdrawal. He does not abuse street or prescription drugs (tried marijuana in the past but did not like it).  Medically patient is obese with sleep apnea.  Associated Signs/Symptoms: Depression Symptoms:  fatigue, difficulty concentrating, impaired memory, disturbed sleep, weight gain, increased appetite, (Hypo) Manic Symptoms:  Distractibility, Irritable Mood, Anxiety Symptoms:  Excessive Worry, Psychotic Symptoms:  Hallucinations: Auditory hypnagogic, occasional PTSD Symptoms: Had a traumatic exposure:  combat trauma  Past Psychiatric History: see above  Previous Psychotropic Medications: Yes   Substance Abuse History in the last 12 months:  Yes.    Consequences of Substance Abuse: Legal Consequences:  DUIs x 3 over 10 years ago  Past Medical History:  Past Medical History:  Diagnosis Date  . ADHD (attention deficit hyperactivity disorder)   . PTSD (post-traumatic stress disorder)     Past Surgical History:  Procedure Laterality Date  . ANKLE SURGERY      Family Psychiatric History: No psychiatric hx.  Family History:  Family History  Problem Relation Age of Onset  . Diabetes Father     Social History:   Social History   Socioeconomic History  . Marital status: Single    Spouse name: Not on file  . Number of children: 1  . Years of education: Not  on file  . Highest education level: Not on file  Occupational History  . Not on file  Social Needs  . Financial resource strain: Not on file  . Food insecurity:    Worry: Not on file    Inability:  Not on file  . Transportation needs:    Medical: Not on file    Non-medical: Not on file  Tobacco Use  . Smoking status: Never Smoker  . Smokeless tobacco: Never Used  Substance and Sexual Activity  . Alcohol use: Yes    Alcohol/week: 4.0 standard drinks    Types: 4 Shots of liquor per week  . Drug use: Not Currently    Types: Marijuana  . Sexual activity: Yes  Lifestyle  . Physical activity:    Days per week: Not on file    Minutes per session: Not on file  . Stress: Not on file  Relationships  . Social connections:    Talks on phone: Not on file    Gets together: Not on file    Attends religious service: Not on file    Active member of club or organization: Not on file    Attends meetings of clubs or organizations: Not on file    Relationship status: Not on file  Other Topics Concern  . Not on file  Social History Narrative  . 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 He does not smoke.  Allergies:  No Known Allergies  Metabolic Disorder Labs: Lab Results  Component Value Date   HGBA1C 5.6 06/30/2015   MPG 114 06/30/2015   No results found for: PROLACTIN Lab Results  Component Value Date   CHOL 237 (H) 06/30/2015   TRIG 97 06/30/2015   HDL 51 06/30/2015   CHOLHDL 4.6 06/30/2015   VLDL 19 06/30/2015   LDLCALC 167 (H) 06/30/2015   LDLCALC 179 (H) 02/03/2013   Lab Results  Component Value Date   TSH 2.178 06/30/2015    Therapeutic Level Labs: No results found for: LITHIUM No results found for: CBMZ No results found for: VALPROATE  Current Medications: Current Outpatient Medications  Medication Sig Dispense Refill  . clotrimazole-betamethasone (LOTRISONE) cream Apply 1 application topically 2 (two) times daily. 45 g 1  . Lorcaserin HCl ER (BELVIQ XR) 20 MG TB24 Take 1 tablet by mouth daily. 30 tablet 3  . meloxicam (MOBIC) 15 MG  tablet One tab PO qAM with breakfast for 2 weeks, then daily prn pain. 30 tablet 3  . traMADol (ULTRAM) 50 MG tablet 1-2 tabs by mouth Q8 hours, maximum 6 tabs per day. 40 tablet 0   No current facility-administered medications for this visit.     Psychiatric Specialty Exam: Review of Systems  Constitutional: Positive for malaise/fatigue.  Musculoskeletal: Positive for back pain and neck pain.  Psychiatric/Behavioral: Positive for memory loss. The patient has insomnia.   All other systems reviewed and are negative.   There were no vitals taken for this visit.There is no height or weight on file to calculate BMI.  General Appearance: NA  Eye Contact:  NA  Speech:  Clear and Coherent  Volume:  Normal  Mood:  Irritable  Affect:  NA  Thought Process:  Goal Directed  Orientation:  Full (Time, Place, and Person)  Thought Content:  Logical  Suicidal Thoughts:  No  Homicidal Thoughts:  No  Memory:  Immediate;  Fair Recent;   Fair Remote;   Good  Judgement:  Fair  Insight:  Fair  Psychomotor Activity:  NA  Concentration:  Concentration: Poor  Recall:  Fair  Fund of Knowledge:Fair  Language: Good  Akathisia:  NA  Handed:  Right  AIMS (if indicated):  not done  Assets:  Communication Skills Desire for Improvement Housing Resilience Social Support  ADL's:  Intact  Cognition: WNL  Sleep:  Poor    Assessment and Plan: 48 yo AAM presents to establish regular mental health follow up both for medication management and counseling. His previous providers are not covered by his insurance. 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 He has been in psychotherapy for that and would like to continue in our practice. He denies feeling particularly depressed  ("Not more than usual") hopeless or suicidal. He admits to past episodes pf passive SI w/o plan or intent. He  has never been psychiatrically hospitalized. There is no hx of clear manic episodes.  He does struggle with focusing/conectration, task completion and working memory. He was previously treated at [facility] system for ADHD. He was prescribed Vyvanse and then dexedrine. He remembers clearly benefiting from the latter. Interestingly enough he does not recall having many problems with focusing when in elementary school. He also was at one time on escitalopram for PTSD but does not remember it being very helpful. He has difficulty with middle insomnia - wakes up after 3-4 hours. Hr does not recall nightmares but his girlfriend would tell him that he often yels and restless at night. He admits to being easily frustrated, angered but cannot describe any clear manic episodes. Occasional nighttime hallucinations possibly related to combat trauma. He has a long hx of alcohol abuse with remote hx of 3 DUIs. Still drinks approx. 4 standard drinks day.  Dx: Adult ADHD; PTSD chronic; Alcohol use disorder mild  Plan: Trial of Adderall xr 30 mg daily. Add Lunesta 2 mg at HS for insomnia and prazosin 1 mg at hx for nightmares. I strongly encouraged him to decrease alcohol intake and at the minimum not drink in the evening prior to takingLunesta. Patient would like to start counseling for PTSD at out practice - appropriate referral will be made.      The plan was discussed with patient. I spend 45 minutes in telephone clinical contact with the patient and devoted approximately 50% of this time to explanation of diagnosis, discussion of treatment options and med education. He will have follow up appointment with me in 3 weeks.   Magdalene Patricia, MD 4/20/202011:43 AM

## 2019-03-02 NOTE — Telephone Encounter (Signed)
Done. OP 

## 2019-04-13 ENCOUNTER — Telehealth (HOSPITAL_COMMUNITY): Payer: Self-pay

## 2019-04-13 ENCOUNTER — Telehealth (HOSPITAL_COMMUNITY): Payer: Self-pay | Admitting: Psychiatry

## 2019-04-13 NOTE — Telephone Encounter (Signed)
Patient called regarding his Adderall 30mg . He stated the pill itself is working but wanted to know if having trouble with having an erection is a side affect of the medication? Please review and advise. Thank you.

## 2019-04-14 NOTE — Telephone Encounter (Signed)
  I have never heard of this side effect - the opposite has been described I.e. more frequent (undesired) or prolonged erections?  OP

## 2019-04-16 ENCOUNTER — Ambulatory Visit (INDEPENDENT_AMBULATORY_CARE_PROVIDER_SITE_OTHER): Payer: Self-pay | Admitting: Psychiatry

## 2019-04-16 ENCOUNTER — Other Ambulatory Visit: Payer: Self-pay

## 2019-04-16 DIAGNOSIS — F4310 Post-traumatic stress disorder, unspecified: Secondary | ICD-10-CM

## 2019-04-16 DIAGNOSIS — F909 Attention-deficit hyperactivity disorder, unspecified type: Secondary | ICD-10-CM

## 2019-04-16 MED ORDER — AMPHETAMINE-DEXTROAMPHET ER 30 MG PO CP24: 30.0000 mg | ORAL_CAPSULE | Freq: Every day | ORAL | 0 refills | Status: DC

## 2019-04-16 MED ORDER — ESZOPICLONE 3 MG PO TABS: 3.0000 mg | ORAL_TABLET | Freq: Every evening | ORAL | 1 refills | Status: DC | PRN

## 2019-04-16 NOTE — Progress Notes (Signed)
BH MD/PA/NP OP Progress Note  04/16/2019 8:38 AM Jonathon Patterson  MRN:  [id number] Interview was conducted by phone and I verified that I was speaking with the correct person using two identifiers. I discussed the limitations of evaluation and management by telemedicine and  the availability of in person appointments. Patient expressed understanding and agreed to proceed.  Chief Complaint: Erectile problems.  HPI: 48 yo AAM presents to establish regular mental health follow up both for medication management and counseling. His previous providers are not covered by his insurance. 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 He has been in psychotherapy for that and would like to continue in our practice. He denies feeling particularly depressed  ("Not more than usual") hopeless or suicidal. He admits to past episodes pf passive SI w/o plan or intent. He has never been psychiatrically hospitalized. There is no hx of clear manic episodes.  He does struggle with focusing/conectration, task completion and working memory. He was previously treated at [facility] system for ADHD. He was prescribed Vyvanse and then dexedrine. He remembers clearly benefiting from the latter. Interestingly enough he does not recall having many problems with focusing when in elementary school. He also was at one time on escitalopram for PTSD but does not remember it being very helpful. He has difficulty with middle insomnia - wakes up after 3-4 hours. Hr does not recall nightmares but his girlfriend would tell him that he often yels and restless at night. He admits to being easily frustrated, angered but cannot describe any clear manic episodes. Occasional nighttime hallucinations possibly related to combat trauma. He has a long hx of alcohol abuse with remote hx of 3 DUIs. Still drinks approx. 4 standard  drinks per week. We added Adderall xr 30 mg daily for ADD, Lunesta 2 mg at HS for insomnia and prazosin 1 mg at hx for nightmares. Patient reports much I,mprovement in focusing sbility but still feels that memory is problematic. He now sleeps well but not more than 4-5 hors (no nightmares). His mood is neutral, no hallucinations/delusions, no SI reported. He noted some problems with erection. He did not have these on Dexedrine and they are not likely adverse effect of Adderall either. Perhaps prazosin may be responsible so we will dc it.   Visit Diagnosis:    ICD-10-CM   1. Adult ADHD F90.9   2. PTSD (post-traumatic stress disorder) F43.10     Past Psychiatric History: Please see intake H&P.  Past Medical History:  Past Medical History:  Diagnosis Date  . ADHD (attention deficit hyperactivity disorder)   . PTSD (post-traumatic stress disorder)     Past Surgical History:  Procedure Laterality Date  . ANKLE SURGERY      Family Psychiatric History: None  Family History:  Family History  Problem Relation Age of Onset  . Diabetes Father     Social History:  Social History   Socioeconomic History  . Marital status: Single    Spouse name: Not on file  . Number of children: 1  . Years of education: Not on file  . Highest education level: Not on file  Occupational History  . Not on file  Social Needs  . Financial resource strain: Not on file  . Food insecurity:    Worry: Not on file    Inability: Not on file  . Transportation needs:    Medical:  Not on file    Non-medical: Not on file  Tobacco Use  . Smoking status: Never Smoker  . Smokeless tobacco: Never Used  Substance and Sexual Activity  . Alcohol use: Yes    Alcohol/week: 4.0 standard drinks    Types: 4 Shots of liquor per week  . Drug use: Not Currently    Types: Marijuana  . Sexual activity: Yes  Lifestyle  . Physical activity:    Days per week: Not on file    Minutes per session: Not on file  . Stress: Not  on file  Relationships  . Social connections:    Talks on phone: Not on file    Gets together: Not on file    Attends religious service: Not on file    Active member of club or organization: Not on file    Attends meetings of clubs or organizations: Not on file    Relationship status: Not on file  Other Topics Concern  . Not on file  Social History Narrative  . Not on file    Allergies: No Known Allergies  Metabolic Disorder Labs: Lab Results  Component Value Date   HGBA1C 5.6 06/30/2015   MPG 114 06/30/2015   No results found for: PROLACTIN Lab Results  Component Value Date   CHOL 237 (H) 06/30/2015   TRIG 97 06/30/2015   HDL 51 06/30/2015   CHOLHDL 4.6 06/30/2015   VLDL 19 06/30/2015   LDLCALC 167 (H) 06/30/2015   LDLCALC 179 (H) 02/03/2013   Lab Results  Component Value Date   TSH 2.178 06/30/2015   TSH 2.815 02/03/2013    Therapeutic Level Labs: No results found for: LITHIUM No results found for: VALPROATE No components found for:  CBMZ  Current Medications: Current Outpatient Medications  Medication Sig Dispense Refill  . amphetamine-dextroamphetamine (ADDERALL XR) 30 MG 24 hr capsule Take 1 capsule (30 mg total) by mouth daily for 30 days. 30 capsule 0  . clotrimazole-betamethasone (LOTRISONE) cream Apply 1 application topically 2 (two) times daily. 45 g 1  . eszopiclone (LUNESTA) 2 MG TABS tablet Take 1 tablet (2 mg total) by mouth at bedtime as needed for up to 30 days for sleep. Take immediately before bedtime 30 tablet 0  . Lorcaserin HCl ER (BELVIQ XR) 20 MG TB24 Take 1 tablet by mouth daily. 30 tablet 3  . meloxicam (MOBIC) 15 MG tablet One tab PO qAM with breakfast for 2 weeks, then daily prn pain. 30 tablet 3  . traMADol (ULTRAM) 50 MG tablet 1-2 tabs by mouth Q8 hours, maximum 6 tabs per day. 40 tablet 0   No current facility-administered medications for this visit.     Psychiatric Specialty Exam: Review of Systems  Genitourinary:        Erectile problems   Psychiatric/Behavioral: The patient has insomnia.   All other systems reviewed and are negative.   There were no vitals taken for this visit.There is no height or weight on file to calculate BMI.  General Appearance: NA  Eye Contact:  NA  Speech:  Clear and Coherent  Volume:  Normal  Mood:  Euthymic  Affect:  NA  Thought Process:  Goal Directed  Orientation:  Full (Time, Place, and Person)  Thought Content: Logical   Suicidal Thoughts:  No  Homicidal Thoughts:  No  Memory:  Immediate;   Good Recent;   Fair Remote;   Fair  Judgement:  Good  Insight:  Good  Psychomotor Activity:  NA  Concentration:  Concentration: Fair  Recall:  Good  Fund of Knowledge: Good  Language: Good  Akathisia:  NA  Handed:  Right  AIMS (if indicated): not done  Assets:  Communication Skills Desire for Improvement Housing  ADL's:  Intact  Cognition: WNL  Sleep:  Fair   Assessment and Plan: 48 yo AAM presents to establish regular mental health follow up both for medication management and counseling. His previous providers are not covered by his insurance. 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 He has been in psychotherapy for that and would like to continue in our practice. He denies feeling particularly depressed  ("Not more than usual") hopeless or suicidal. He admits to past episodes pf passive SI w/o plan or intent. He has never been psychiatrically hospitalized. There is no hx of clear manic episodes.  He does struggle with focusing/conectration, task completion and working memory. He was previously treated at [facility] system for ADHD. He was prescribed Vyvanse and then dexedrine. He remembers clearly benefiting from the latter. Interestingly enough he does not recall having many problems with focusing when in elementary school. He also was at  one time on escitalopram for PTSD but does not remember it being very helpful. He has difficulty with middle insomnia - wakes up after 3-4 hours. Hr does not recall nightmares but his girlfriend would tell him that he often yels and restless at night. He admits to being easily frustrated, angered but cannot describe any clear manic episodes. Occasional nighttime hallucinations possibly related to combat trauma. He has a long hx of alcohol abuse with remote hx of 3 DUIs. Still drinks approx. 4 standard drinks per week. We added Adderall xr 30 mg daily for ADD, Lunesta 2 mg at HS for insomnia and prazosin 1 mg at hx for nightmares. Patient reports much I,mprovement in focusing sbility but still feels that memory is problematic. He now sleeps well but not more than 4-5 hors (no nightmares). His mood is neutral, no hallucinations/delusions, no SI reported. He noted some problems with erection. He did not have these on Dexedrine and they are not likely adverse effect of Adderall either. Perhaps prazosin may be responsible so we will dc it. We will continue Adderall XR 30 mg and increase dose of Lunesta to 3 mg in hope of increasing his sleep time. Next appointment in 7-8 weeks.   Magdalene Patricia, MD 04/16/2019, 8:38 AM

## 2019-04-17 ENCOUNTER — Telehealth (HOSPITAL_COMMUNITY): Payer: Self-pay | Admitting: Psychiatry

## 2019-04-17 NOTE — Telephone Encounter (Signed)
10:05am 04/17/19 Called the patietn due to no insurance on file and the insurance we have on file is termed.  Per the patient he was referred by the [facility] but we no information and the patient could not tell me any insurance information - Suggested to the patient that he contact the [facility] to get his VA insurance information and patient is aso aware until we get that information he will be self-pay.Marland KitchenMarguerite Olea

## 2019-05-12 ENCOUNTER — Other Ambulatory Visit (HOSPITAL_COMMUNITY): Payer: Self-pay | Admitting: Psychiatry

## 2019-05-12 ENCOUNTER — Telehealth (HOSPITAL_COMMUNITY): Payer: Self-pay

## 2019-05-12 MED ORDER — AMPHETAMINE-DEXTROAMPHET ER 30 MG PO CP24: 30.0000 mg | ORAL_CAPSULE | Freq: Every day | ORAL | 0 refills | Status: DC

## 2019-05-12 NOTE — Telephone Encounter (Signed)
Patient is calling for a refill on the Adderall, patient uses [facility] pharmacy

## 2019-05-12 NOTE — Telephone Encounter (Signed)
Done

## 2019-06-09 ENCOUNTER — Ambulatory Visit (INDEPENDENT_AMBULATORY_CARE_PROVIDER_SITE_OTHER): Payer: No Typology Code available for payment source | Admitting: Psychiatry

## 2019-06-09 ENCOUNTER — Other Ambulatory Visit: Payer: Self-pay

## 2019-06-09 ENCOUNTER — Telehealth (HOSPITAL_COMMUNITY): Payer: Self-pay

## 2019-06-09 DIAGNOSIS — F4310 Post-traumatic stress disorder, unspecified: Secondary | ICD-10-CM

## 2019-06-09 DIAGNOSIS — F1010 Alcohol abuse, uncomplicated: Secondary | ICD-10-CM | POA: Diagnosis not present

## 2019-06-09 DIAGNOSIS — F909 Attention-deficit hyperactivity disorder, unspecified type: Secondary | ICD-10-CM | POA: Diagnosis not present

## 2019-06-09 MED ORDER — ZOLPIDEM TARTRATE ER 12.5 MG PO TBCR: 12.5000 mg | EXTENDED_RELEASE_TABLET | Freq: Every evening | ORAL | 0 refills | Status: DC | PRN

## 2019-06-09 MED ORDER — DEXTROAMPHETAMINE SULFATE 20 MG PO TABS: 20.0000 mg | ORAL_TABLET | Freq: Two times a day (BID) | ORAL | 0 refills | Status: DC

## 2019-06-09 MED ORDER — PRAZOSIN HCL 1 MG PO CAPS: 1.0000 mg | ORAL_CAPSULE | Freq: Every day | ORAL | 0 refills | Status: DC

## 2019-06-09 NOTE — Telephone Encounter (Signed)
Jonathon Patterson, the pharmacist at [facility] called, he states that the Dextroamphetamine 20 mg can only be ordered as name brand and that the price is astronomical. He said that the 10 mg is generic and a lot less expensive and would like to know if you want to change the prescription to 10 mg 2 tabs daily.Marland KitchenMarland KitchenPlease review and advise, thank you

## 2019-06-09 NOTE — Telephone Encounter (Signed)
Anyway we can save patients money is a way to go so I am all for generic version of two tablets @10  mg each.

## 2019-06-09 NOTE — Telephone Encounter (Signed)
Would you mind sending it in? I am unable to send ADHD medications.Marland KitchenMarland KitchenMarland Kitchen

## 2019-06-09 NOTE — Progress Notes (Signed)
[place] MD/PA/NP OP Progress Note  06/09/2019 8:51 AM Jonathon Patterson  MRN:  [id number] Interview was conducted by phone and I verified that I was speaking with the correct person using two identifiers. I discussed the limitations of evaluation and management by telemedicine and  the availability of in person appointments. Patient expressed understanding and agreed to proceed.  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 He denies feeling particularly depressed ("Not more than usual") hopeless or suicidal. He admits to past episodes of passive SI w/o plan or intent. He has never been psychiatrically hospitalized. There is no hx of clear manic episodes. He does struggle with focusing/conectration, task completion and working memory. He was previously treated at [facility] system for ADHD. He was prescribed Vyvanse and then dexedrine. He remembers clearly benefiting from the latter. Interestingly enough he does not recall having many problems with focusing when in elementary school. He also was at one time on escitalopram for PTSD but it was not helpful. He has difficulty with middle insomnia - wakes up after 3-4 hours and has nightmares. He has a long hx of alcohol abuse with remote hx of 3 DUIs. Still drinks approx. 4 standard drinks per week. We added Adderall XR 30 mg daily for ADD, Lunesta 3 mg at HS for insomnia and prazosin 1 mg at hx for nightmares. Patient reports much improvement in focusing ability but his sleep has not improved. Nightmares resolved on prazosin. He has been complaining about erectile problems ever since starting Addarall (he did not have those on Dexedrine).  His mood is neutral, no hallucinations/delusions, no SI reported.    Visit Diagnosis:    ICD-10-CM   1. Adult ADHD  F90.9    2. PTSD (post-traumatic stress disorder)  F43.10   3. Alcohol use disorder, mild, abuse  F10.10     Past Psychiatric History: Please see intake H&P>  Past Medical History:  Past Medical History:  Diagnosis Date  . ADHD (attention deficit hyperactivity disorder)   . PTSD (post-traumatic stress disorder)     Past Surgical History:  Procedure Laterality Date  . ANKLE SURGERY      Family Psychiatric History: None  Family History:  Family History  Problem Relation Age of Onset  . Diabetes Father     Social History:  Social History   Socioeconomic History  . Marital status: Single    Spouse name: Not on file  . Number of children: 1  . Years of education: Not on file  . Highest education level: Not on file  Occupational History  . Not on file  Social Needs  . Financial resource strain: Not on file  . Food insecurity    Worry: Not on file    Inability: Not on file  . Transportation needs    Medical: Not on file    Non-medical: Not on file  Tobacco Use  . Smoking status: Never Smoker  . Smokeless tobacco: Never Used  Substance and Sexual Activity  . Alcohol use: Yes    Alcohol/week: 4.0 standard drinks    Types: 4 Shots of liquor per week  . Drug use: Not Currently    Types: Marijuana  . Sexual activity: Yes  Lifestyle  . Physical activity    Days per week: Not on file    Minutes  per session: Not on file  . Stress: Not on file  Relationships  . Social Musicianconnections    Talks on phone: Not on file    Gets together: Not on file    Attends religious service: Not on file    Active member of club or organization: Not on file    Attends meetings of clubs or organizations: Not on file    Relationship status: Not on file  Other Topics Concern  . Not on file  Social History Narrative  . Not on file    Allergies: No Known Allergies  Metabolic Disorder Labs: Lab Results  Component Value Date   HGBA1C 5.6 06/30/2015   MPG 114 06/30/2015   No results found  for: PROLACTIN Lab Results  Component Value Date   CHOL 237 (H) 06/30/2015   TRIG 97 06/30/2015   HDL 51 06/30/2015   CHOLHDL 4.6 06/30/2015   VLDL 19 06/30/2015   LDLCALC 167 (H) 06/30/2015   LDLCALC 179 (H) 02/03/2013   Lab Results  Component Value Date   TSH 2.178 06/30/2015   TSH 2.815 02/03/2013    Therapeutic Level Labs: No results found for: LITHIUM No results found for: VALPROATE No components found for:  CBMZ  Current Medications: Current Outpatient Medications  Medication Sig Dispense Refill  . clotrimazole-betamethasone (LOTRISONE) cream Apply 1 application topically 2 (two) times daily. 45 g 1  . dextroamphetamine 20 MG TABS Take 20 mg by mouth 2 (two) times daily with breakfast and lunch. 60 tablet 0  . Lorcaserin HCl ER (BELVIQ XR) 20 MG TB24 Take 1 tablet by mouth daily. 30 tablet 3  . meloxicam (MOBIC) 15 MG tablet One tab PO qAM with breakfast for 2 weeks, then daily prn pain. 30 tablet 3  . prazosin (MINIPRESS) 1 MG capsule Take 1 capsule (1 mg total) by mouth at bedtime. 30 capsule 0  . traMADol (ULTRAM) 50 MG tablet 1-2 tabs by mouth Q8 hours, maximum 6 tabs per day. 40 tablet 0  . zolpidem (AMBIEN CR) 12.5 MG CR tablet Take 1 tablet (12.5 mg total) by mouth at bedtime as needed for sleep. 30 tablet 0   No current facility-administered medications for this visit.      Psychiatric Specialty Exam: Review of Systems  Psychiatric/Behavioral: The patient has insomnia.   All other systems reviewed and are negative.   There were no vitals taken for this visit.There is no height or weight on file to calculate BMI.  General Appearance: NA  Eye Contact:  NA  Speech:  Clear and Coherent and Normal Rate  Volume:  Normal  Mood:  Euthymic  Affect:  NA  Thought Process:  Goal Directed  Orientation:  Full (Time, Place, and Person)  Thought Content: Logical   Suicidal Thoughts:  No  Homicidal Thoughts:  No  Memory:  Immediate;   Good Recent;   Good Remote;    Good  Judgement:  Fair  Insight:  Good  Psychomotor Activity:  NA  Concentration:  Concentration: Fair  Recall:  Good  Fund of Knowledge: Good  Language: Good  Akathisia:  Negative  Handed:  Right  AIMS (if indicated): not done  Assets:  Communication Skills Desire for Improvement Financial Resources/Insurance Housing Intimacy  ADL's:  Intact  Cognition: WNL  Sleep:  Poor    Assessment and Plan: 48 yo AAM  with hx of treatment for  PTSD sx (anger/irritability, intrusive memories, nightmares, poor sleep) related [place]  served as a Arts development officer[employer]. He denies feeling particularly depressed ("Not more than usual") hopeless or suicidal. He admits to past episodes of passive SI w/o plan or intent. He has never been psychiatrically hospitalized. There is no hx of clear manic episodes. He does struggle with focusing/conectration, task completion and working memory. He was [facility] for ADHD. He was prescribed Vyvanse and then dexedrine. He remembers clearly benefiting from the latter. Interestingly enough he does not recall having many problems with focusing when in elementary school. He also was at one time on escitalopram for PTSD but it was not helpful. He has difficulty with middle insomnia - wakes up after 3-4 hours and has nightmares. He has a long hx of alcohol abuse with remote hx of 3 DUIs. Still drinks approx. 4 standard drinks per week. We added Adderall XR 30 mg daily for ADD, Lunesta 3 mg at HS for insomnia and prazosin 1 mg at hx for nightmares. Patient reports much improvement in focusing ability but his sleep has not improved. Nightmares resolved on prazosin. He has been complaining about erectile problems ever since starting Addarall (he did not have those on Dexedrine).  His mood is neutral, no hallucinations/delusions, no SI reported.   Plan: We will dc Adderall XR 30 mg and start dextroamphetamine 20 mg bid. We will  try Ambien CR instead of Lunesta and resume prazosin (held in case it was causing erectile problem). Next appointment in 4 weeks. The plan was discussed with patient who had an opportunity to ask questions and these were all answered. I spend 25 minutes in phone consultation with the patient.    Magdalene Patricialgierd A [place], MD 06/09/2019, 8:51 AM

## 2019-06-10 ENCOUNTER — Other Ambulatory Visit (HOSPITAL_COMMUNITY): Payer: Self-pay | Admitting: Psychiatry

## 2019-06-10 MED ORDER — DEXTROAMPHETAMINE SULFATE 10 MG PO TABS: 20.0000 mg | ORAL_TABLET | Freq: Two times a day (BID) | ORAL | 0 refills | Status: DC

## 2019-06-10 NOTE — Telephone Encounter (Signed)
Done although when I ordered 20 mg before it also said in epic that it is a generic version?

## 2019-07-08 ENCOUNTER — Other Ambulatory Visit: Payer: Self-pay

## 2019-07-08 ENCOUNTER — Ambulatory Visit (INDEPENDENT_AMBULATORY_CARE_PROVIDER_SITE_OTHER): Payer: No Typology Code available for payment source | Admitting: Psychiatry

## 2019-07-08 DIAGNOSIS — F4310 Post-traumatic stress disorder, unspecified: Secondary | ICD-10-CM | POA: Diagnosis not present

## 2019-07-08 DIAGNOSIS — F909 Attention-deficit hyperactivity disorder, unspecified type: Secondary | ICD-10-CM

## 2019-07-08 DIAGNOSIS — F1010 Alcohol abuse, uncomplicated: Secondary | ICD-10-CM | POA: Diagnosis not present

## 2019-07-08 MED ORDER — PRAZOSIN HCL 1 MG PO CAPS: 1.0000 mg | ORAL_CAPSULE | Freq: Every day | ORAL | 2 refills | Status: DC

## 2019-07-08 MED ORDER — ZOLPIDEM TARTRATE ER 12.5 MG PO TBCR: 12.5000 mg | EXTENDED_RELEASE_TABLET | Freq: Every evening | ORAL | 0 refills | Status: DC | PRN

## 2019-07-08 MED ORDER — DEXTROAMPHETAMINE SULFATE 10 MG PO TABS: 20.0000 mg | ORAL_TABLET | Freq: Two times a day (BID) | ORAL | 0 refills | Status: DC

## 2019-07-08 NOTE — Progress Notes (Signed)
[place] MD/PA/NP OP Progress Note  07/08/2019 9:41 AM Jonathon Patterson  MRN:  [id number] Interview was conducted by phone and I verified that I was speaking with the correct person using two identifiers. I discussed the limitations of evaluation and management by telemedicine and  the availability of in person appointments. Patient expressed understanding and agreed to proceed.  Chief Complaint: Middle insomnia.  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 He denies feeling particularly depressed ("Not more than usual") hopeless or suicidal. He admits to past episodes of passive SI w/o plan or intent. He has never been psychiatrically hospitalized. There is no hx of clear manic episodes. He does struggle with focusing/conectration, task completion and working memory and was previously treated at [facility] system for ADHD. He was prescribed Vyvanse and then dexedrine. Interestingly enough he does not recall having many problems with focusing when in elementary school. He has been complaining about erectile problems ever since starting Addarall (he thought he did not have those on Dexedrine but when we switched to it he still reports the same problem). He also was at one time on escitalopram for PTSD but it was not helpful. He has difficulty with middle insomnia - wakes up after 3-4 hours and has nightmares. He has a long hx of alcohol abuse with remote hx of 3 DUIs. Still drinks approx. 4 standard drinksper week. We addedAmbien CR for insomnia but somehow Rx did not get to the pharmacy so he did not try it yet. Nightmares resolved on prazosin. His mood is neutral, no hallucinations/delusions, no SI reported.   Visit Diagnosis:    ICD-10-CM   1. Adult ADHD  F90.9   2. Alcohol use disorder, mild, abuse  F10.10   3. PTSD (post-traumatic stress disorder)  F43.10      Past Psychiatric History: Please see intake H&P.  Past Medical History:  Past Medical History:  Diagnosis Date  . ADHD (attention deficit hyperactivity disorder)   . PTSD (post-traumatic stress disorder)     Past Surgical History:  Procedure Laterality Date  . ANKLE SURGERY      Family Psychiatric History: None  Family History:  Family History  Problem Relation Age of Onset  . Diabetes Father     Social History:  Social History   Socioeconomic History  . Marital status: Single    Spouse name: Not on file  . Number of children: 1  . Years of education: Not on file  . Highest education level: Not on file  Occupational History  . Not on file  Social Needs  . Financial resource strain: Not on file  . Food insecurity    Worry: Not on file    Inability: Not on file  . Transportation needs    Medical: Not on file    Non-medical: Not on file  Tobacco Use  . Smoking status: Never Smoker  . Smokeless tobacco: Never Used  Substance and Sexual Activity  . Alcohol use: Yes    Alcohol/week: 4.0 standard drinks    Types: 4 Shots of liquor per week  . Drug use: Not Currently    Types: Marijuana  . Sexual activity: Yes  Lifestyle  . Physical activity    Days per week: Not on file    Minutes per session: Not on file  . Stress: Not on file  Relationships  .  Social Musician on phone: Not on file    Gets together: Not on file    Attends religious service: Not on file    Active member of club or organization: Not on file    Attends meetings of clubs or organizations: Not on file    Relationship status: Not on file  Other Topics Concern  . Not on file  Social History Narrative  . Not on file    Allergies: No Known Allergies  Metabolic Disorder Labs: Lab Results  Component Value Date   HGBA1C 5.6 06/30/2015   MPG 114 06/30/2015   No results found for: PROLACTIN Lab Results  Component Value Date   CHOL 237 (H) 06/30/2015   TRIG 97 06/30/2015    HDL 51 06/30/2015   CHOLHDL 4.6 06/30/2015   VLDL 19 06/30/2015   LDLCALC 167 (H) 06/30/2015   LDLCALC 179 (H) 02/03/2013   Lab Results  Component Value Date   TSH 2.178 06/30/2015   TSH 2.815 02/03/2013    Therapeutic Level Labs: No results found for: LITHIUM No results found for: VALPROATE No components found for:  CBMZ  Current Medications: Current Outpatient Medications  Medication Sig Dispense Refill  . clotrimazole-betamethasone (LOTRISONE) cream Apply 1 application topically 2 (two) times daily. 45 g 1  . dextroamphetamine (DEXTROSTAT) 10 MG tablet Take 2 tablets (20 mg total) by mouth 2 (two) times daily with breakfast and lunch. 120 tablet 0  . Lorcaserin HCl ER (BELVIQ XR) 20 MG TB24 Take 1 tablet by mouth daily. 30 tablet 3  . meloxicam (MOBIC) 15 MG tablet One tab PO qAM with breakfast for 2 weeks, then daily prn pain. 30 tablet 3  . prazosin (MINIPRESS) 1 MG capsule Take 1 capsule (1 mg total) by mouth at bedtime. 30 capsule 2  . traMADol (ULTRAM) 50 MG tablet 1-2 tabs by mouth Q8 hours, maximum 6 tabs per day. 40 tablet 0  . zolpidem (AMBIEN CR) 12.5 MG CR tablet Take 1 tablet (12.5 mg total) by mouth at bedtime as needed for sleep. 30 tablet 0   No current facility-administered medications for this visit.      Psychiatric Specialty Exam: Review of Systems  Genitourinary:       Erectile problems  Psychiatric/Behavioral: The patient has insomnia.   All other systems reviewed and are negative.   There were no vitals taken for this visit.There is no height or weight on file to calculate BMI.  General Appearance: NA  Eye Contact:  NA  Speech:  Clear and Coherent and Normal Rate  Volume:  Normal  Mood:  Euthymic  Affect:  NA  Thought Process:  Goal Directed  Orientation:  Full (Time, Place, and Person)  Thought Content: Logical   Suicidal Thoughts:  No  Homicidal Thoughts:  No  Memory:  Immediate;   Good Recent;   Good Remote;   Good  Judgement:  Fair   Insight:  Good  Psychomotor Activity:  NA  Concentration:  Concentration: Good  Recall:  Good  Fund of Knowledge: Good  Language: Good  Akathisia:  Negative  Handed:  Right  AIMS (if indicated): not done  Assets:  Communication Skills Desire for Improvement Financial Resources/Insurance Housing Physical Health Social Support  ADL's:  Intact  Cognition: WNL  Sleep:  Fair    Assessment and Plan: 48 yo AAM  with hx of treatment for  PTSD sx (anger/irritability, intrusive memories, nightmares, poor sleep) related to his experiences in [place] [place] [facility]  where he servedas a [employer]. He denies feeling particularly depressed ("Not more than usual") hopeless or suicidal. He admits to past episodes of passive SI w/o plan or intent. He has never been psychiatrically hospitalized. There is no hx of clear manic episodes. He does struggle with focusing/conectration, task completion and working memory and [facility]. He was prescribed Vyvanse and then dexedrine. Interestingly enough he does not recall having many problems with focusing when in elementary school. He has been complaining about erectile problems ever since starting Addarall (he thought he did not have those on Dexedrine but when we switched to it he still reports the same problem). He also was at one time on escitalopram for PTSD but it was not helpful. He has difficulty with middle insomnia - wakes up after 3-4 hours and has nightmares. He has a long hx of alcohol abuse with remote hx of 3 DUIs. Still drinks approx. 4 standard drinksper week. We addedAmbien CR for insomnia but somehow Rx did not get to the pharmacy so he did not try it yet. Nightmares resolved on prazosin. His mood is neutral, no hallucinations/delusions, no SI reported.   Plan: We will continue prazosin and dexedrine 20 mg bid. I will reorder Ambien CR 12.5 mg. Next appointment in 4 weeks. The plan was discussed with patient who  had an opportunity to ask questions and these were all answered. I spend 25 minutes in phone consultation with the patient.    Magdalene Patricialgierd A Krislyn Donnan, MD 07/08/2019, 9:41 AM

## 2019-08-06 ENCOUNTER — Other Ambulatory Visit: Payer: Self-pay

## 2019-08-06 ENCOUNTER — Ambulatory Visit (INDEPENDENT_AMBULATORY_CARE_PROVIDER_SITE_OTHER): Payer: No Typology Code available for payment source | Admitting: Psychiatry

## 2019-08-06 DIAGNOSIS — F909 Attention-deficit hyperactivity disorder, unspecified type: Secondary | ICD-10-CM | POA: Diagnosis not present

## 2019-08-06 DIAGNOSIS — F4310 Post-traumatic stress disorder, unspecified: Secondary | ICD-10-CM | POA: Diagnosis not present

## 2019-08-06 MED ORDER — DEXTROAMPHETAMINE SULFATE 10 MG PO TABS: 25.0000 mg | ORAL_TABLET | Freq: Two times a day (BID) | ORAL | 0 refills | Status: DC

## 2019-08-06 MED ORDER — ZOLPIDEM TARTRATE ER 12.5 MG PO TBCR: 12.5000 mg | EXTENDED_RELEASE_TABLET | Freq: Every evening | ORAL | 2 refills | Status: DC | PRN

## 2019-08-06 NOTE — Progress Notes (Signed)
[place] MD/PA/NP OP Progress Note  08/06/2019 9:40 AM Jonathon Patterson  MRN:  [id number] Interview was conducted by phone and I verified that I was speaking with the correct person using two identifiers. I discussed the limitations of evaluation and management by telemedicine and  the availability of in person appointments. Patient expressed understanding and agreed to proceed.  Chief Complaint: "I sleep better".  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 He denies feeling particularly depressed, hopeless or suicidal. He does struggle with focusing/conectration, task completion and working memory and was previously treated at [facility] system for ADHD. He was prescribed Vyvanse and then dexedrine. He has been complaining about erectile problems ever since we added Addarall (he thought he did not have those on Dexedrine but when we switched to it he still reports the same problem). He still reports some issues witn focusing and would like to try a slightly higher dose. He also was at one time on escitalopram for PTSD butit was not helpful.He had difficulty with middle insomnia - but this has resolved with CR Ambien. There is a hx of alcohol abuse with remote hx of 3 DUIs. He now only drinks approx. 4 standard drinksper week. Nightmares resolved on prazosin.His mood is neutral, no hallucinations/delusions, no SI reported.    Visit Diagnosis:    ICD-10-CM   1. Adult ADHD  F90.9   2. PTSD (post-traumatic stress disorder)  F43.10     Past Psychiatric History: Please see intake H&P.  Past Medical History:  Past Medical History:  Diagnosis Date  . ADHD (attention deficit hyperactivity disorder)   . PTSD (post-traumatic stress disorder)     Past Surgical History:  Procedure Laterality Date  . ANKLE SURGERY      Family Psychiatric History:  None.  Family History:  Family History  Problem Relation Age of Onset  . Diabetes Father     Social History:  Social History   Socioeconomic History  . Marital status: Single    Spouse name: Not on file  . Number of children: 1  . Years of education: Not on file  . Highest education level: Not on file  Occupational History  . Not on file  Social Needs  . Financial resource strain: Not on file  . Food insecurity    Worry: Not on file    Inability: Not on file  . Transportation needs    Medical: Not on file    Non-medical: Not on file  Tobacco Use  . Smoking status: Never Smoker  . Smokeless tobacco: Never Used  Substance and Sexual Activity  . Alcohol use: Yes    Alcohol/week: 4.0 standard drinks    Types: 4 Shots of liquor per week  . Drug use: Not Currently    Types: Marijuana  . Sexual activity: Yes  Lifestyle  . Physical activity    Days per week: Not on file    Minutes per session: Not on file  . Stress: Not on file  Relationships  . Social Herbalist on phone: Not on file    Gets together: Not on file    Attends religious service: Not on file    Active member of club or organization: Not on file    Attends meetings of clubs or organizations: Not on file    Relationship status: Not on file  Other Topics Concern  .  Not on file  Social History Narrative  . Not on file    Allergies: No Known Allergies  Metabolic Disorder Labs: Lab Results  Component Value Date   HGBA1C 5.6 06/30/2015   MPG 114 06/30/2015   No results found for: PROLACTIN Lab Results  Component Value Date   CHOL 237 (H) 06/30/2015   TRIG 97 06/30/2015   HDL 51 06/30/2015   CHOLHDL 4.6 06/30/2015   VLDL 19 06/30/2015   LDLCALC 167 (H) 06/30/2015   LDLCALC 179 (H) 02/03/2013   Lab Results  Component Value Date   TSH 2.178 06/30/2015   TSH 2.815 02/03/2013    Therapeutic Level Labs: No results found for: LITHIUM No results found for: VALPROATE No components  found for:  CBMZ  Current Medications: Current Outpatient Medications  Medication Sig Dispense Refill  . clotrimazole-betamethasone (LOTRISONE) cream Apply 1 application topically 2 (two) times daily. 45 g 1  . dextroamphetamine (DEXTROSTAT) 10 MG tablet Take 2.5 tablets (25 mg total) by mouth 2 (two) times daily with breakfast and lunch. 150 tablet 0  . Lorcaserin HCl ER (BELVIQ XR) 20 MG TB24 Take 1 tablet by mouth daily. 30 tablet 3  . meloxicam (MOBIC) 15 MG tablet One tab PO qAM with breakfast for 2 weeks, then daily prn pain. 30 tablet 3  . prazosin (MINIPRESS) 1 MG capsule Take 1 capsule (1 mg total) by mouth at bedtime. 30 capsule 2  . traMADol (ULTRAM) 50 MG tablet 1-2 tabs by mouth Q8 hours, maximum 6 tabs per day. 40 tablet 0  . zolpidem (AMBIEN CR) 12.5 MG CR tablet Take 1 tablet (12.5 mg total) by mouth at bedtime as needed for sleep. 30 tablet 2   No current facility-administered medications for this visit.      Psychiatric Specialty Exam: Review of Systems  All other systems reviewed and are negative.   There were no vitals taken for this visit.There is no height or weight on file to calculate BMI.  General Appearance: NA  Eye Contact:  NA  Speech:  Clear and Coherent and Normal Rate  Volume:  Normal  Mood:  Euthymic  Affect:  NA  Thought Process:  Goal Directed and Linear  Orientation:  Full (Time, Place, and Person)  Thought Content: Logical   Suicidal Thoughts:  No  Homicidal Thoughts:  No  Memory:  Immediate;   Good Recent;   Good Remote;   Good  Judgement:  Good  Insight:  Good  Psychomotor Activity:  NA  Concentration:  Concentration: Fair  Recall:  Good  Fund of Knowledge: Good  Language: Good  Akathisia:  Negative  Handed:  Right  AIMS (if indicated): not done  Assets:  Communication Skills Desire for Improvement Financial Resources/Insurance Housing Physical Health Social Support  ADL's:  Intact  Cognition: WNL  Sleep:  Fair     Assessment and Plan: 48 yo AAMwith hx of treatment forPTSD sx (anger/irritability, intrusive memories, nightmares, poor sleep) 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 He denies feeling particularly depressed, hopeless or suicidal. He does struggle with focusing/conectration, task completion and working memory and was previously treated at [facility] for ADHD. He was prescribed Vyvanse and then dexedrine. He has been complaining about erectile problems ever since we added Addarall (he thought he did not have those on Dexedrine but when we switched to it he still reports the same problem). He still reports some issues witn focusing and would like to try  a slightly higher dose. He also was at one time on escitalopram for PTSD butit was not helpful.He had difficulty with middle insomnia - but this has resolved with CR Ambien. There is a hx of alcohol abuse with remote hx of 3 DUIs. He now only drinks approx. 4 standard drinksper week. Nightmares resolved on prazosin.His mood is neutral, no hallucinations/delusions, no SI reported.   Dx: PTSD chronic; Adult ADHD  Plan: Continue prazosin, Ambien CR and increase dexedrine to 25 mg bid. Next appointment in 2 months. The plan was discussed with patient who had an opportunity to ask questions and these were all answered. I spend25 minutes inphone consultation with the patient.    Magdalene Patricialgierd A Yon Schiffman, MD 08/06/2019, 9:40 AM

## 2019-10-06 ENCOUNTER — Other Ambulatory Visit: Payer: Self-pay

## 2019-10-06 ENCOUNTER — Ambulatory Visit (INDEPENDENT_AMBULATORY_CARE_PROVIDER_SITE_OTHER): Payer: No Typology Code available for payment source | Admitting: Psychiatry

## 2019-10-06 DIAGNOSIS — N529 Male erectile dysfunction, unspecified: Secondary | ICD-10-CM | POA: Diagnosis not present

## 2019-10-06 DIAGNOSIS — F4312 Post-traumatic stress disorder, chronic: Secondary | ICD-10-CM | POA: Diagnosis not present

## 2019-10-06 DIAGNOSIS — F909 Attention-deficit hyperactivity disorder, unspecified type: Secondary | ICD-10-CM

## 2019-10-06 DIAGNOSIS — F4310 Post-traumatic stress disorder, unspecified: Secondary | ICD-10-CM

## 2019-10-06 MED ORDER — TADALAFIL 10 MG PO TABS: 10.0000 mg | ORAL_TABLET | Freq: Every day | ORAL | 2 refills | Status: DC | PRN

## 2019-10-06 MED ORDER — PRAZOSIN HCL 1 MG PO CAPS: 1.0000 mg | ORAL_CAPSULE | Freq: Every day | ORAL | 2 refills | Status: DC

## 2019-10-06 MED ORDER — DEXTROAMPHETAMINE SULFATE 10 MG PO TABS: 25.0000 mg | ORAL_TABLET | Freq: Two times a day (BID) | ORAL | 0 refills | Status: DC

## 2019-10-06 MED ORDER — ZOLPIDEM TARTRATE ER 12.5 MG PO TBCR: 12.5000 mg | EXTENDED_RELEASE_TABLET | Freq: Every evening | ORAL | 2 refills | Status: DC | PRN

## 2019-10-06 NOTE — Progress Notes (Signed)
[facility] MD/PA/NP OP Progress Note  10/06/2019 9:38 AM Jonathon Patterson  MRN:  [id number] Interview was conducted by phone and I verified that I was speaking with the correct person using two identifiers. I discussed the limitations of evaluation and management by telemedicine and  the availability of in person appointments. Patient expressed understanding and agreed to proceed.  Chief Complaint: Erectile problems (long standing).  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 He denies feeling particularly depressed, hopeless or suicidal. He does struggle with focusing/conectration, task completion and working [employer] waspreviously treated at [facility] for ADHD. He was prescribed Vyvanse and then dexedrine. He has been complaining about erectile problems ever since we added Addarall (hethought hedid not have those on Dexedrine but when we switched to it he still reports the same problem). Current dose of Dexedrine works well. He also was at one time on escitalopram for PTSD butit was not helpful.He had difficulty with middle insomnia - but this has resolved with CR Ambien. There is a hx of alcohol abuse with remote hx of 3 DUIs. He now only drinks approx. 4 standard drinksper week. Nightmares resolved on prazosin.His mood is neutral, no hallucinations/delusions, no SI reported.    Visit Diagnosis:    ICD-10-CM   1. Adult ADHD  F90.9   2. PTSD (post-traumatic stress disorder)  F43.10     Past Psychiatric History: Please see intake H&P.  Past Medical History:  Past Medical History:  Diagnosis Date  . ADHD (attention deficit hyperactivity disorder)   . PTSD (post-traumatic stress disorder)     Past Surgical History:  Procedure Laterality Date  . ANKLE SURGERY      Family Psychiatric History: None.  Family History:  Family  History  Problem Relation Age of Onset  . Diabetes Father     Social History:  Social History   Socioeconomic History  . Marital status: Single    Spouse name: Not on file  . Number of children: 1  . Years of education: Not on file  . Highest education level: Not on file  Occupational History  . Not on file  Social Needs  . Financial resource strain: Not on file  . Food insecurity    Worry: Not on file    Inability: Not on file  . Transportation needs    Medical: Not on file    Non-medical: Not on file  Tobacco Use  . Smoking status: Never Smoker  . Smokeless tobacco: Never Used  Substance and Sexual Activity  . Alcohol use: Yes    Alcohol/week: 4.0 standard drinks    Types: 4 Shots of liquor per week  . Drug use: Not Currently    Types: Marijuana  . Sexual activity: Yes  Lifestyle  . Physical activity    Days per week: Not on file    Minutes per session: Not on file  . Stress: Not on file  Relationships  . Social Musician on phone: Not on file    Gets together: Not on file    Attends religious service: Not on file    Active member of club or organization: Not on file    Attends meetings of clubs or organizations: Not on file    Relationship status: Not on file  Other Topics Concern  . Not on file  Social History Narrative  . Not on  file    Allergies: No Known Allergies  Metabolic Disorder Labs: Lab Results  Component Value Date   HGBA1C 5.6 06/30/2015   MPG 114 06/30/2015   No results found for: PROLACTIN Lab Results  Component Value Date   CHOL 237 (H) 06/30/2015   TRIG 97 06/30/2015   HDL 51 06/30/2015   CHOLHDL 4.6 06/30/2015   VLDL 19 06/30/2015   LDLCALC 167 (H) 06/30/2015   LDLCALC 179 (H) 02/03/2013   Lab Results  Component Value Date   TSH 2.178 06/30/2015   TSH 2.815 02/03/2013    Therapeutic Level Labs: No results found for: LITHIUM No results found for: VALPROATE No components found for:  CBMZ  Current  Medications: Current Outpatient Medications  Medication Sig Dispense Refill  . clotrimazole-betamethasone (LOTRISONE) cream Apply 1 application topically 2 (two) times daily. 45 g 1  . dextroamphetamine (DEXTROSTAT) 10 MG tablet Take 2.5 tablets (25 mg total) by mouth 2 (two) times daily with breakfast and lunch. 150 tablet 0  . Lorcaserin HCl ER (BELVIQ XR) 20 MG TB24 Take 1 tablet by mouth daily. 30 tablet 3  . meloxicam (MOBIC) 15 MG tablet One tab PO qAM with breakfast for 2 weeks, then daily prn pain. 30 tablet 3  . prazosin (MINIPRESS) 1 MG capsule Take 1 capsule (1 mg total) by mouth at bedtime. 30 capsule 2  . tadalafil (CIALIS) 10 MG tablet Take 1 tablet (10 mg total) by mouth daily as needed for erectile dysfunction. 10 tablet 2  . traMADol (ULTRAM) 50 MG tablet 1-2 tabs by mouth Q8 hours, maximum 6 tabs per day. 40 tablet 0  . zolpidem (AMBIEN CR) 12.5 MG CR tablet Take 1 tablet (12.5 mg total) by mouth at bedtime as needed for sleep. 30 tablet 2   No current facility-administered medications for this visit.       Psychiatric Specialty Exam: Review of Systems  All other systems reviewed and are negative.   There were no vitals taken for this visit.There is no height or weight on file to calculate BMI.  General Appearance: NA  Eye Contact:  NA  Speech:  Clear and Coherent and Normal Rate  Volume:  Normal  Mood:  Euthymic  Affect:  NA  Thought Process:  Goal Directed and Linear  Orientation:  Full (Time, Place, and Person)  Thought Content: Logical   Suicidal Thoughts:  No  Homicidal Thoughts:  No  Memory:  Immediate;   Good Recent;   Good Remote;   Good  Judgement:  Good  Insight:  Good  Psychomotor Activity:  NA  Concentration:  Concentration: Good  Recall:  Good  Fund of Knowledge: Good  Language: Good  Akathisia:  Negative  Handed:  Right  AIMS (if indicated): not done  Assets:  Communication Skills Desire for Improvement Financial  Resources/Insurance Housing Physical Health Resilience  ADL's:  Intact  Cognition: WNL  Sleep:  Good    Assessment and Plan: 48 yo AAMwith hx of treatment forPTSD sx (anger/irritability, intrusive memories, nightmares, poor sleep) 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 He denies feeling particularly depressed, hopeless or suicidal. He does struggle with focusing/conectration, task completion and working [employer] waspreviously treated at [facility] system for ADHD. He was prescribed Vyvanse and then dexedrine. He has been complaining about erectile problems ever since we added Addarall (hethought hedid not have those on Dexedrine but when we switched to it he still reports the same problem). Current dose of  Dexedrine works well. He also was at one time on escitalopram for PTSD butit was not helpful.He had difficulty with middle insomnia - but this has resolved with CR Ambien. There is a hx of alcohol abuse with remote hx of 3 DUIs. He now only drinks approx. 4 standard drinksper week. Nightmares resolved on prazosin.His mood is neutral, no hallucinations/delusions, no SI reported.   Dx: PTSD chronic; Adult ADHD; Erectile dysfunction  Plan: Continue prazosin, Ambien CR and dexedrine 25 mg bid. I will add Cialis per his request for erectile dysfunction 10 mg prn (10 tabs monthly). Next appointment in 3 months. The plan was discussed with patient who had an opportunity to ask questions and these were all answered. I spend25 minutes inphone consultation with the patient.   Magdalene Patricialgierd A Ameliya Nicotra, MD 10/06/2019, 9:38 AM

## 2019-11-24 ENCOUNTER — Other Ambulatory Visit (HOSPITAL_COMMUNITY): Payer: Self-pay | Admitting: Psychiatry

## 2019-11-24 ENCOUNTER — Telehealth (HOSPITAL_COMMUNITY): Payer: Self-pay | Admitting: *Deleted

## 2019-11-24 MED ORDER — DEXTROAMPHETAMINE SULFATE 10 MG PO TABS: 25.0000 mg | ORAL_TABLET | Freq: Two times a day (BID) | ORAL | 0 refills | Status: DC

## 2019-11-24 NOTE — Telephone Encounter (Signed)
Done

## 2019-11-24 NOTE — Telephone Encounter (Signed)
Received request for refill of ADHD Medication Dextrostat  25mg  Po BID, from [facility]. Pt has an upcoming appointment 12/17/19. Please review.

## 2019-12-17 ENCOUNTER — Telehealth (HOSPITAL_COMMUNITY): Payer: Self-pay | Admitting: *Deleted

## 2019-12-17 ENCOUNTER — Other Ambulatory Visit (HOSPITAL_COMMUNITY): Payer: Self-pay | Admitting: Psychiatry

## 2019-12-17 NOTE — Telephone Encounter (Signed)
Pt called requesting a hard copy Rx for the Dextrostat 10mg  as he gets his scripts from and they require this. Please review.

## 2019-12-17 NOTE — Telephone Encounter (Signed)
He should have enough for one more week and I will print and sign it either tomorrow PM or over weekend and leave it in "my" folder.

## 2019-12-20 ENCOUNTER — Other Ambulatory Visit (HOSPITAL_COMMUNITY): Payer: Self-pay | Admitting: Psychiatry

## 2019-12-20 MED ORDER — DEXTROAMPHETAMINE SULFATE 10 MG PO TABS: 25.0000 mg | ORAL_TABLET | Freq: Two times a day (BID) | ORAL | 0 refills | Status: DC

## 2020-01-06 ENCOUNTER — Ambulatory Visit (INDEPENDENT_AMBULATORY_CARE_PROVIDER_SITE_OTHER): Payer: No Typology Code available for payment source | Admitting: Psychiatry

## 2020-01-06 ENCOUNTER — Other Ambulatory Visit: Payer: Self-pay

## 2020-01-06 DIAGNOSIS — F909 Attention-deficit hyperactivity disorder, unspecified type: Secondary | ICD-10-CM

## 2020-01-06 DIAGNOSIS — F4310 Post-traumatic stress disorder, unspecified: Secondary | ICD-10-CM | POA: Diagnosis not present

## 2020-01-06 DIAGNOSIS — F1010 Alcohol abuse, uncomplicated: Secondary | ICD-10-CM

## 2020-01-06 MED ORDER — PRAZOSIN HCL 1 MG PO CAPS: 1.0000 mg | ORAL_CAPSULE | Freq: Every day | ORAL | 1 refills | Status: DC

## 2020-01-06 MED ORDER — TADALAFIL 20 MG PO TABS: 20.0000 mg | ORAL_TABLET | Freq: Every day | ORAL | 2 refills | Status: DC | PRN

## 2020-01-06 NOTE — Progress Notes (Addendum)
BH MD/PA/NP OP Progress Note  01/06/2020 9:42 AM Jonathon Patterson  MRN:  [id number] Interview was conducted by phone and I verified that I was speaking with the correct person using two identifiers. I discussed the limitations of evaluation and management by telemedicine and  the availability of in person appointments. Patient expressed understanding and agreed to proceed. Patient location - home; physician - home office.  Chief Complaint: "I am doing well".  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 He denies feeling particularly depressed, hopeless or suicidal. He does struggle with focusing/conectration, task completion and working [employer] waspreviously treated at [facility] for ADHD. He was prescribed Vyvanse and then dexedrine. He has been complaining about erectile problems ever sincewe addedAddarall (hethought hedid not have those on Dexedrine but when we switched to it he still reports the same problem).Current dose of Dexedrine works well. He also was at one time on escitalopram for PTSD butit was not helpful.He haddifficulty with middle insomnia - but this has resolved with CR Ambien. There is ahx of alcohol abuse with remote hx of 3 DUIs.He now onlydrinks approx. 4 standard drinksper week. Nightmares resolved on prazosin.His mood is neutral, no hallucinations/delusions, no SI reported.He has been on Cialis for erectile dysfunction. Price of Dexedrine increased by $30 at [facility] and he would like to have his Rx send to [facility] pharmacy (needs written RX for Dexedrine and Ambien).  Visit Diagnosis:    ICD-10-CM   1. Adult ADHD  F90.9   2. Alcohol use disorder, mild, abuse  F10.10   3. PTSD (post-traumatic stress disorder)  F43.10     Past Psychiatric History: Please see intake H&P.  Past Medical History:  Past Medical  History:  Diagnosis Date  . ADHD (attention deficit hyperactivity disorder)   . PTSD (post-traumatic stress disorder)     Past Surgical History:  Procedure Laterality Date  . ANKLE SURGERY      Family Psychiatric History: None.  Family History:  Family History  Problem Relation Age of Onset  . Diabetes Father     Social History:  Social History   Socioeconomic History  . Marital status: Single    Spouse name: Not on file  . Number of children: 1  . Years of education: Not on file  . Highest education level: Not on file  Occupational History  . Not on file  Tobacco Use  . Smoking status: Never Smoker  . Smokeless tobacco: Never Used  Substance and Sexual Activity  . Alcohol use: Yes    Alcohol/week: 4.0 standard drinks    Types: 4 Shots of liquor per week  . Drug use: Not Currently    Types: Marijuana  . Sexual activity: Yes  Other Topics Concern  . Not on file  Social History Narrative  . Not on file   Social Determinants of Health   Financial Resource Strain:   . Difficulty of Paying Living Expenses: Not on file  Food Insecurity:   . Worried About Programme researcher, broadcasting/film/video in the Last Year: Not on file  . Ran Out of Food in the Last Year: Not on file  Transportation Needs:   . Lack of Transportation (Medical): Not on file  . Lack of Transportation (Non-Medical): Not on file  Physical Activity:   . Days of Exercise per Week: Not on file  . Minutes of Exercise per Session:  Not on file  Stress:   . Feeling of Stress : Not on file  Social Connections:   . Frequency of Communication with Friends and Family: Not on file  . Frequency of Social Gatherings with Friends and Family: Not on file  . Attends Religious Services: Not on file  . Active Member of Clubs or Organizations: Not on file  . Attends Archivist Meetings: Not on file  . Marital Status: Not on file    Allergies: No Known Allergies  Metabolic Disorder Labs: Lab Results  Component Value  Date   HGBA1C 5.6 06/30/2015   MPG 114 06/30/2015   No results found for: PROLACTIN Lab Results  Component Value Date   CHOL 237 (H) 06/30/2015   TRIG 97 06/30/2015   HDL 51 06/30/2015   CHOLHDL 4.6 06/30/2015   VLDL 19 06/30/2015   LDLCALC 167 (H) 06/30/2015   LDLCALC 179 (H) 02/03/2013   Lab Results  Component Value Date   TSH 2.178 06/30/2015   TSH 2.815 02/03/2013    Therapeutic Level Labs: No results found for: LITHIUM No results found for: VALPROATE No components found for:  CBMZ  Current Medications: Current Outpatient Medications  Medication Sig Dispense Refill  . clotrimazole-betamethasone (LOTRISONE) cream Apply 1 application topically 2 (two) times daily. 45 g 1  . [START ON 02/22/2020] dextroamphetamine (DEXTROSTAT) 10 MG tablet Take 2.5 tablets (25 mg total) by mouth 2 (two) times daily with breakfast and lunch. 150 tablet 0  . [START ON 01/23/2020] dextroamphetamine (DEXTROSTAT) 10 MG tablet Take 2.5 tablets (25 mg total) by mouth 2 (two) times daily with breakfast and lunch. 150 tablet 0  . dextroamphetamine (DEXTROSTAT) 10 MG tablet Take 2.5 tablets (25 mg total) by mouth 2 (two) times daily with breakfast and lunch. 150 tablet 0  . Lorcaserin HCl ER (BELVIQ XR) 20 MG TB24 Take 1 tablet by mouth daily. 30 tablet 3  . meloxicam (MOBIC) 15 MG tablet One tab PO qAM with breakfast for 2 weeks, then daily prn pain. 30 tablet 3  . prazosin (MINIPRESS) 1 MG capsule Take 1 capsule (1 mg total) by mouth at bedtime. 90 capsule 1  . tadalafil (CIALIS) 20 MG tablet Take 1 tablet (20 mg total) by mouth daily as needed for erectile dysfunction. 10 tablet 2  . traMADol (ULTRAM) 50 MG tablet 1-2 tabs by mouth Q8 hours, maximum 6 tabs per day. 40 tablet 0  . zolpidem (AMBIEN CR) 12.5 MG CR tablet Take 1 tablet (12.5 mg total) by mouth at bedtime as needed for sleep. 30 tablet 2   No current facility-administered medications for this visit.     Psychiatric Specialty  Exam: Review of Systems  Psychiatric/Behavioral: Positive for decreased concentration and sleep disturbance.  All other systems reviewed and are negative.   There were no vitals taken for this visit.There is no height or weight on file to calculate BMI.  General Appearance: NA  Eye Contact:  NA  Speech:  Clear and Coherent and Normal Rate  Volume:  Normal  Mood:  Euthymic  Affect:  NA  Thought Process:  Goal Directed and Linear  Orientation:  Full (Time, Place, and Person)  Thought Content: Logical   Suicidal Thoughts:  No  Homicidal Thoughts:  No  Memory:  Immediate;   Good Recent;   Good Remote;   Good  Judgement:  Good  Insight:  Good  Psychomotor Activity:  NA  Concentration:  Concentration: Fair  Recall:  [place]  of Knowledge: Good  Language: Good  Akathisia:  Negative  Handed:  Right  AIMS (if indicated): not done  Assets:  Communication Skills Desire for Improvement Financial Resources/Insurance Housing  ADL's:  Intact  Cognition: WNL  Sleep:  Good    Assessment and Plan: 49 yo AAMwith hx of treatment forPTSD sx (anger/irritability, intrusive memories, nightmares, poor sleep) 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 He denies feeling particularly depressed, hopeless or suicidal. He does struggle with focusing/conectration, task completion and working [employer] waspreviously treated at [facility] for ADHD. He was prescribed Vyvanse and then dexedrine. He has been complaining about erectile problems ever sincewe addedAddarall (hethought hedid not have those on Dexedrine but when we switched to it he still reports the same problem).Current dose of Dexedrine works well. He also was at one time on escitalopram for PTSD butit was not helpful.He haddifficulty with middle insomnia - but this has resolved with CR Ambien. There is ahx of alcohol abuse with remote hx of 3 DUIs.He now onlydrinks approx. 4 standard  drinksper week. Nightmares resolved on prazosin.His mood is neutral, no hallucinations/delusions, no SI reported.He has been on Cialis for erectile dysfunction. Price of Dexedrine increased by $30 at [facility] and he would like to have his Rx send to [facility] pharmacy (needs written RX for Dexedrine and Ambien).  Dx: PTSD chronic; Adult ADHD; Erectile dysfunction  Plan:Continue prazosin, Ambien CR, dexedrine25mg  bid and Cialis 20 mg for erectile dysfunction prn (10 tabs monthly). Next appointment in3 months. The plan was discussed with patient who had an opportunity to ask questions and these were all answered. I spend25 minutes inphone consultation with the patient.    Magdalene Patricia, MD 01/06/2020, 9:42 AM

## 2020-01-08 ENCOUNTER — Other Ambulatory Visit (HOSPITAL_COMMUNITY): Payer: Self-pay | Admitting: Psychiatry

## 2020-01-08 MED ORDER — DEXTROAMPHETAMINE SULFATE 10 MG PO TABS: 25.0000 mg | ORAL_TABLET | Freq: Two times a day (BID) | ORAL | 0 refills | Status: DC

## 2020-01-08 MED ORDER — ZOLPIDEM TARTRATE ER 12.5 MG PO TBCR: 12.5000 mg | EXTENDED_RELEASE_TABLET | Freq: Every evening | ORAL | 2 refills | Status: DC | PRN

## 2020-03-24 ENCOUNTER — Other Ambulatory Visit: Payer: Self-pay

## 2020-03-24 ENCOUNTER — Telehealth (INDEPENDENT_AMBULATORY_CARE_PROVIDER_SITE_OTHER): Payer: No Typology Code available for payment source | Admitting: Psychiatry

## 2020-03-24 DIAGNOSIS — Z634 Disappearance and death of family member: Secondary | ICD-10-CM | POA: Diagnosis not present

## 2020-03-24 DIAGNOSIS — F4310 Post-traumatic stress disorder, unspecified: Secondary | ICD-10-CM | POA: Diagnosis not present

## 2020-03-24 DIAGNOSIS — F909 Attention-deficit hyperactivity disorder, unspecified type: Secondary | ICD-10-CM | POA: Diagnosis not present

## 2020-03-24 MED ORDER — TADALAFIL 20 MG PO TABS: 20.0000 mg | ORAL_TABLET | Freq: Every day | ORAL | 2 refills | Status: DC | PRN

## 2020-03-24 NOTE — Progress Notes (Signed)
BH MD/PA/NP OP Progress Note  03/24/2020 9:38 AM Jonathon Patterson  MRN:  [id number] Interview was conducted by phone and I verified that I was speaking with the correct person using two identifiers. I discussed the limitations of evaluation and management by telemedicine and  the availability of in person appointments. Patient expressed understanding and agreed to proceed.  Chief Complaint: "It's been a rough month".  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 He denies feeling particularly depressed, hopeless or suicidal. He does struggle with focusing/conectration, task completion and working [employer] waspreviously treated at [employer] [employer] [facility] for ADHD. He was prescribed Vyvanse and then dexedrine. He has been complaining about erectile problems ever sincewe addedAddarall (hethought hedid not have those on Dexedrine but when we switched to it he still reports the same problem).Current dose of Dexedrine works well.He also was at one time on escitalopram for PTSD butit was not helpful.He haddifficulty with middle insomnia - but this has resolved with CR Ambien. There is ahx of alcohol abuse with remote hx of 3 DUIs.He now onlydrinks approx. 4 standard drinksper week. Nightmares resolved on prazosin.He lost two family members in the past two months and his mood declined. He is not hopeless or suicidal.He has been on Cialis for erectile dysfunction.   Visit Diagnosis:    ICD-10-CM   1. PTSD (post-traumatic stress disorder)  F43.10   2. Adult ADHD  F90.9   3. Bereavement  Z63.4     Past Psychiatric History: Please see intake H&P.  Past Medical History:  Past Medical History:  Diagnosis Date  . ADHD (attention deficit hyperactivity disorder)   . PTSD (post-traumatic stress disorder)     Past Surgical History:  Procedure Laterality  Date  . ANKLE SURGERY      Family Psychiatric History: None.  Family History:  Family History  Problem Relation Age of Onset  . Diabetes Father     Social History:  Social History   Socioeconomic History  . Marital status: Single    Spouse name: Not on file  . Number of children: 1  . Years of education: Not on file  . Highest education level: Not on file  Occupational History  . Not on file  Tobacco Use  . Smoking status: Never Smoker  . Smokeless tobacco: Never Used  Substance and Sexual Activity  . Alcohol use: Yes    Alcohol/week: 4.0 standard drinks    Types: 4 Shots of liquor per week  . Drug use: Not Currently    Types: Marijuana  . Sexual activity: Yes  Other Topics Concern  . Not on file  Social History Narrative  . Not on file   Social Determinants of Health   Financial Resource Strain:   . Difficulty of Paying Living Expenses:   Food Insecurity:   . Worried About Programme researcher, broadcasting/film/video in the Last Year:   . Barista in the Last Year:   Transportation Needs:   . Freight forwarder (Medical):   Marland Kitchen Lack of Transportation (Non-Medical):   Physical Activity:   . Days of Exercise per Week:   . Minutes of Exercise per Session:   Stress:   . Feeling of Stress :   Social Connections:   . Frequency of Communication with Friends and Family:   . Frequency of Social Gatherings with Friends and Family:   . Attends Religious  Services:   . Active Member of Clubs or Organizations:   . Attends Archivist Meetings:   Marland Kitchen Marital Status:     Allergies: No Known Allergies  Metabolic Disorder Labs: Lab Results  Component Value Date   HGBA1C 5.6 06/30/2015   MPG 114 06/30/2015   No results found for: PROLACTIN Lab Results  Component Value Date   CHOL 237 (H) 06/30/2015   TRIG 97 06/30/2015   HDL 51 06/30/2015   CHOLHDL 4.6 06/30/2015   VLDL 19 06/30/2015   LDLCALC 167 (H) 06/30/2015   LDLCALC 179 (H) 02/03/2013   Lab Results   Component Value Date   TSH 2.178 06/30/2015   TSH 2.815 02/03/2013    Therapeutic Level Labs: No results found for: LITHIUM No results found for: VALPROATE No components found for:  CBMZ  Current Medications: Current Outpatient Medications  Medication Sig Dispense Refill  . clotrimazole-betamethasone (LOTRISONE) cream Apply 1 application topically 2 (two) times daily. 45 g 1  . dextroamphetamine (DEXTROSTAT) 10 MG tablet Take 2.5 tablets (25 mg total) by mouth 2 (two) times daily with breakfast and lunch. 150 tablet 0  . dextroamphetamine (DEXTROSTAT) 10 MG tablet Take 2.5 tablets (25 mg total) by mouth 2 (two) times daily with breakfast and lunch. 150 tablet 0  . dextroamphetamine (DEXTROSTAT) 10 MG tablet Take 2.5 tablets (25 mg total) by mouth 2 (two) times daily with breakfast and lunch. 150 tablet 0  . Lorcaserin HCl ER (BELVIQ XR) 20 MG TB24 Take 1 tablet by mouth daily. 30 tablet 3  . meloxicam (MOBIC) 15 MG tablet One tab PO qAM with breakfast for 2 weeks, then daily prn pain. 30 tablet 3  . prazosin (MINIPRESS) 1 MG capsule Take 1 capsule (1 mg total) by mouth at bedtime. 90 capsule 1  . tadalafil (CIALIS) 20 MG tablet Take 1 tablet (20 mg total) by mouth daily as needed for erectile dysfunction. 10 tablet 2  . traMADol (ULTRAM) 50 MG tablet 1-2 tabs by mouth Q8 hours, maximum 6 tabs per day. 40 tablet 0  . zolpidem (AMBIEN CR) 12.5 MG CR tablet Take 1 tablet (12.5 mg total) by mouth at bedtime as needed for sleep. 30 tablet 2   No current facility-administered medications for this visit.     Psychiatric Specialty Exam: Review of Systems  Psychiatric/Behavioral: The patient is nervous/anxious.   All other systems reviewed and are negative.   There were no vitals taken for this visit.There is no height or weight on file to calculate BMI.  General Appearance: NA  Eye Contact:  NA  Speech:  Clear and Coherent and Normal Rate  Volume:  Normal  Mood:  Depressed  Affect:   NA  Thought Process:  Goal Directed and Linear  Orientation:  Full (Time, Place, and Person)  Thought Content: Logical   Suicidal Thoughts:  No  Homicidal Thoughts:  No  Memory:  Immediate;   Good Recent;   Good Remote;   Good  Judgement:  Good  Insight:  Good  Psychomotor Activity:  NA  Concentration:  Concentration: Good  Recall:  Good  Fund of Knowledge: Good  Language: Good  Akathisia:  Negative  Handed:  Right  AIMS (if indicated): not done  Assets:  Communication Skills Desire for Improvement Financial Resources/Insurance Housing Resilience  ADL's:  Intact  Cognition: WNL  Sleep:  Fair   Assessment and Plan: 49yo AAMwith hx of treatment forPTSD sx (anger/irritability, intrusive memories, nightmares, poor sleep) related to his  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 He denies feeling particularly depressed, hopeless or suicidal. He does struggle with focusing/conectration, task completion and working [employer] waspreviously treated at [employer] [facility] for ADHD. He was prescribed Vyvanse and then dexedrine. He has been complaining about erectile problems ever sincewe addedAddarall (hethought hedid not have those on Dexedrine but when we switched to it he still reports the same problem).Current dose of Dexedrine works well.He also was at one time on escitalopram for PTSD butit was not helpful.He haddifficulty with middle insomnia - but this has resolved with CR Ambien. There is ahx of alcohol abuse with remote hx of 3 DUIs.He now onlydrinks approx. 4 standard drinksper week. Nightmares resolved on prazosin.He lost two family members in the past two months and his mood declined. He is not hopeless or suicidal.He has been on Cialis for erectile dysfunction.   Dx: PTSD chronic; Adult ADHD; Bereavement; Erectile dysfunction  Plan:Continue prazosin, Ambien CR, dexedrine25mg  bid and Cialis 20 mg for erectile dysfunction prn (10 tabs  monthly).He would like to have a counseling appointment/grief counseling.Next appointment in30months. The plan was discussed with patient who had an opportunity to ask questions and these were all answered. I spend25 minutes inphone consultation with the patient.    Magdalene Patricia, MD 03/24/2020, 9:38 AM

## 2020-05-03 ENCOUNTER — Telehealth (HOSPITAL_COMMUNITY): Payer: Self-pay

## 2020-05-03 NOTE — Telephone Encounter (Signed)
Patient called and stated that he needs a printed script for his Dextroamphetamine 10mg  tablet that he can pick up on Monday and take to the [facility] to get filled. Thank you.

## 2020-05-03 NOTE — Telephone Encounter (Signed)
I will have this printed on Friday.

## 2020-05-06 ENCOUNTER — Other Ambulatory Visit (HOSPITAL_COMMUNITY): Payer: Self-pay | Admitting: Psychiatry

## 2020-05-06 MED ORDER — DEXTROAMPHETAMINE SULFATE 10 MG PO TABS: 25.0000 mg | ORAL_TABLET | Freq: Two times a day (BID) | ORAL | 0 refills | Status: DC

## 2020-05-06 MED ORDER — ZOLPIDEM TARTRATE ER 12.5 MG PO TBCR: 12.5000 mg | EXTENDED_RELEASE_TABLET | Freq: Every evening | ORAL | 2 refills | Status: DC | PRN

## 2020-05-24 ENCOUNTER — Telehealth (HOSPITAL_COMMUNITY): Payer: No Typology Code available for payment source | Admitting: Psychiatry

## 2020-06-20 ENCOUNTER — Telehealth (HOSPITAL_COMMUNITY): Payer: No Typology Code available for payment source | Admitting: Psychiatry

## 2020-06-20 ENCOUNTER — Other Ambulatory Visit: Payer: Self-pay

## 2020-07-06 ENCOUNTER — Telehealth (INDEPENDENT_AMBULATORY_CARE_PROVIDER_SITE_OTHER): Payer: No Typology Code available for payment source | Admitting: Psychiatry

## 2020-07-06 ENCOUNTER — Other Ambulatory Visit: Payer: Self-pay

## 2020-07-06 DIAGNOSIS — F909 Attention-deficit hyperactivity disorder, unspecified type: Secondary | ICD-10-CM | POA: Diagnosis not present

## 2020-07-06 DIAGNOSIS — F4310 Post-traumatic stress disorder, unspecified: Secondary | ICD-10-CM | POA: Diagnosis not present

## 2020-07-06 MED ORDER — PRAZOSIN HCL 1 MG PO CAPS: 1.0000 mg | ORAL_CAPSULE | Freq: Every day | ORAL | 1 refills | Status: DC

## 2020-07-06 MED ORDER — TADALAFIL 20 MG PO TABS: 20.0000 mg | ORAL_TABLET | Freq: Every day | ORAL | 2 refills | Status: DC | PRN

## 2020-07-06 NOTE — Progress Notes (Signed)
BH MD/PA/NP OP Progress Note  07/06/2020 9:18 AM Jonathon Patterson  MRN:  [id number] Interview was conducted by phone and I verified that I was speaking with the correct person using two identifiers. I discussed the limitations of evaluation and management by telemedicine and  the availability of in person appointments. Patient expressed understanding and agreed to proceed. Patient location - home; physician - home office.  Chief Complaint: "I am doing OK".  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 He reports good, stable mood, normal appetite and improved sleep (no nightmares) on zolpidem/prazosin combination. He has a hx of treatment for ADHD at [facility]. He was prescribed Vyvanse and then dexedrine. He has been complaining about erectile problems ever sincewe addedAddarall (hethought hedid not have those on Dexedrine but when we switched to it he still reports the same problem).Current dose of Dexedrine works well.He also was at one time on escitalopram for PTSD butit was not helpful.He haddifficulty with middle insomnia - but this has resolved with CR Ambien. There is ahx of alcohol abuse with remote hx of 3 DUIs.He now onlydrinks approx. 4 standard drinksper week. He has been on Cialis for erectile dysfunction.   Visit Diagnosis:    ICD-10-CM   1. PTSD (post-traumatic stress disorder)  F43.10   2. Adult ADHD  F90.9     Past Psychiatric History: Please see intake H&P.  Past Medical History:  Past Medical History:  Diagnosis Date  . ADHD (attention deficit hyperactivity disorder)   . PTSD (post-traumatic stress disorder)     Past Surgical History:  Procedure Laterality Date  . ANKLE SURGERY      Family Psychiatric History: None.  Family History:  Family History  Problem Relation Age of Onset  . Diabetes Father     Social  History:  Social History   Socioeconomic History  . Marital status: Single    Spouse name: Not on file  . Number of children: 1  . Years of education: Not on file  . Highest education level: Not on file  Occupational History  . Not on file  Tobacco Use  . Smoking status: Never Smoker  . Smokeless tobacco: Never Used  Vaping Use  . Vaping Use: Never used  Substance and Sexual Activity  . Alcohol use: Yes    Alcohol/week: 4.0 standard drinks    Types: 4 Shots of liquor per week  . Drug use: Not Currently    Types: Marijuana  . Sexual activity: Yes  Other Topics Concern  . Not on file  Social History Narrative  . Not on file   Social Determinants of Health   Financial Resource Strain:   . Difficulty of Paying Living Expenses: Not on file  Food Insecurity:   . Worried About Programme researcher, broadcasting/film/video in the Last Year: Not on file  . Ran Out of Food in the Last Year: Not on file  Transportation Needs:   . Lack of Transportation (Medical): Not on file  . Lack of Transportation (Non-Medical): Not on file  Physical Activity:   . Days of Exercise per Week: Not on file  . Minutes of Exercise per Session: Not on file  Stress:   . Feeling of Stress : Not on file  Social Connections:   . Frequency of Communication with Friends and Family: Not on file  . Frequency of Social Gatherings with Friends and Family:  Not on file  . Attends Religious Services: Not on file  . Active Member of Clubs or Organizations: Not on file  . Attends Banker Meetings: Not on file  . Marital Status: Not on file    Allergies: No Known Allergies  Metabolic Disorder Labs: Lab Results  Component Value Date   HGBA1C 5.6 06/30/2015   MPG 114 06/30/2015   No results found for: PROLACTIN Lab Results  Component Value Date   CHOL 237 (H) 06/30/2015   TRIG 97 06/30/2015   HDL 51 06/30/2015   CHOLHDL 4.6 06/30/2015   VLDL 19 06/30/2015   LDLCALC 167 (H) 06/30/2015   LDLCALC 179 (H)  02/03/2013   Lab Results  Component Value Date   TSH 2.178 06/30/2015   TSH 2.815 02/03/2013    Therapeutic Level Labs: No results found for: LITHIUM No results found for: VALPROATE No components found for:  CBMZ  Current Medications: Current Outpatient Medications  Medication Sig Dispense Refill  . clotrimazole-betamethasone (LOTRISONE) cream Apply 1 application topically 2 (two) times daily. 45 g 1  . [START ON 07/09/2020] dextroamphetamine (DEXTROSTAT) 10 MG tablet Take 2.5 tablets (25 mg total) by mouth 2 (two) times daily with breakfast and lunch. 150 tablet 0  . dextroamphetamine (DEXTROSTAT) 10 MG tablet Take 2.5 tablets (25 mg total) by mouth 2 (two) times daily with breakfast and lunch. 150 tablet 0  . dextroamphetamine (DEXTROSTAT) 10 MG tablet Take 2.5 tablets (25 mg total) by mouth 2 (two) times daily with breakfast and lunch. 150 tablet 0  . Lorcaserin HCl ER (BELVIQ XR) 20 MG TB24 Take 1 tablet by mouth daily. 30 tablet 3  . meloxicam (MOBIC) 15 MG tablet One tab PO qAM with breakfast for 2 weeks, then daily prn pain. 30 tablet 3  . prazosin (MINIPRESS) 1 MG capsule Take 1 capsule (1 mg total) by mouth at bedtime. 90 capsule 1  . tadalafil (CIALIS) 20 MG tablet Take 1 tablet (20 mg total) by mouth daily as needed for erectile dysfunction. 10 tablet 2  . traMADol (ULTRAM) 50 MG tablet 1-2 tabs by mouth Q8 hours, maximum 6 tabs per day. 40 tablet 0  . zolpidem (AMBIEN CR) 12.5 MG CR tablet Take 1 tablet (12.5 mg total) by mouth at bedtime as needed for sleep. 30 tablet 2   No current facility-administered medications for this visit.     Psychiatric Specialty Exam: Review of Systems  All other systems reviewed and are negative.   There were no vitals taken for this visit.There is no height or weight on file to calculate BMI.  General Appearance: NA  Eye Contact:  NA  Speech:  Clear and Coherent and Normal Rate  Volume:  Normal  Mood:  Euthymic  Affect:  NA  Thought  Process:  Goal Directed and Linear  Orientation:  Full (Time, Place, and Person)  Thought Content: Logical   Suicidal Thoughts:  No  Homicidal Thoughts:  No  Memory:  Immediate;   Good Recent;   Good Remote;   Good  Judgement:  Good  Insight:  Good  Psychomotor Activity:  NA  Concentration:  Concentration: Good  Recall:  Good  Fund of Knowledge: Good  Language: Good  Akathisia:  Negative  Handed:  Right  AIMS (if indicated): not done  Assets:  Communication Skills Desire for Improvement Financial Resources/Insurance Housing Talents/Skills  ADL's:  Intact  Cognition: WNL  Sleep:  Fair    Assessment and Plan: 48yo AAMwith hx of  treatment forPTSD sx (anger/irritability, intrusive memories, nightmares, poor sleep) 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 He reports good, stable mood, normal appetite and improved sleep (no nightmares) on zolpidem/prazosin combination. He has a hx of treatment for ADHD at [facility]. He was prescribed Vyvanse and then dexedrine. He has been complaining about erectile problems ever sincewe addedAddarall (hethought hedid not have those on Dexedrine but when we switched to it he still reports the same problem).Current dose of Dexedrine works well.He also was at one time on escitalopram for PTSD butit was not helpful.He haddifficulty with middle insomnia - but this has resolved with CR Ambien. There is ahx of alcohol abuse with remote hx of 3 DUIs.He now onlydrinks approx. 4 standard drinksper week. He has been on Cialis for erectile dysfunction.   Dx: PTSD chronic; Adult ADHD; Erectile dysfunction  Plan:Continue prazosin, Ambien CR,dexedrine25mg  bidandCialis20 mgfor erectile dysfunction prn (10 tabs monthly).Next appointment in79months. The plan was discussed with patient who had an opportunity to ask questions and these were all answered. I spend15 minutes inphone consultation with the  patient.    Magdalene Patricia, MD 07/06/2020, 9:18 AM

## 2020-07-10 ENCOUNTER — Other Ambulatory Visit (HOSPITAL_COMMUNITY): Payer: Self-pay | Admitting: Psychiatry

## 2020-07-10 MED ORDER — DEXTROAMPHETAMINE SULFATE 10 MG PO TABS: 25.0000 mg | ORAL_TABLET | Freq: Two times a day (BID) | ORAL | 0 refills | Status: DC

## 2020-07-10 MED ORDER — ZOLPIDEM TARTRATE ER 12.5 MG PO TBCR: 12.5000 mg | EXTENDED_RELEASE_TABLET | Freq: Every evening | ORAL | 2 refills | Status: DC | PRN

## 2020-09-30 ENCOUNTER — Other Ambulatory Visit: Payer: Self-pay

## 2020-09-30 ENCOUNTER — Telehealth (HOSPITAL_COMMUNITY): Payer: No Typology Code available for payment source | Admitting: Psychiatry

## 2020-12-29 ENCOUNTER — Telehealth (INDEPENDENT_AMBULATORY_CARE_PROVIDER_SITE_OTHER): Payer: No Typology Code available for payment source | Admitting: Psychiatry

## 2020-12-29 ENCOUNTER — Other Ambulatory Visit: Payer: Self-pay

## 2020-12-29 DIAGNOSIS — F909 Attention-deficit hyperactivity disorder, unspecified type: Secondary | ICD-10-CM | POA: Diagnosis not present

## 2020-12-29 DIAGNOSIS — F4310 Post-traumatic stress disorder, unspecified: Secondary | ICD-10-CM | POA: Diagnosis not present

## 2020-12-29 MED ORDER — DEXTROAMPHETAMINE SULFATE 10 MG PO TABS: 25.0000 mg | ORAL_TABLET | Freq: Two times a day (BID) | ORAL | 0 refills | Status: DC

## 2020-12-29 MED ORDER — PRAZOSIN HCL 1 MG PO CAPS: 1.0000 mg | ORAL_CAPSULE | Freq: Every day | ORAL | 1 refills | Status: DC

## 2020-12-29 MED ORDER — ZOLPIDEM TARTRATE ER 12.5 MG PO TBCR: 12.5000 mg | EXTENDED_RELEASE_TABLET | Freq: Every evening | ORAL | 2 refills | Status: DC | PRN

## 2020-12-29 NOTE — Progress Notes (Signed)
BH MD/PA/NP OP Progress Note  12/29/2020 1:45 PM Jonathon Patterson  MRN:  [id number] Interview was conducted by phone and I verified that I was speaking with the correct person using two identifiers. I discussed the limitations of evaluation and management by telemedicine and  the availability of in person appointments. Patient expressed understanding and agreed to proceed. Participants in the visit: patient (location - home); physician (location - home office).  Chief Complaint: Poor sleep, lack of focus.  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 He has not been seen since  August last year (missed appointments) and run out of zolpidem and dextrostat.  He reports good, stable mood, normal appetite but predicatbly increased problems with sleep and distractibility. He takes prazosin for nightmares. He has a hx of treatment for ADHD at [facility]. He was prescribed Vyvanse and then dexedrine. He has been complaining about erectile problems ever sincewe addedAddarall (hethought hedid not have those on Dexedrine so it was switched back to that medication. He also was at one time on escitalopram for PTSD butit was not helpful.He haddifficulty with middle insomnia - but this has resolved with CR Ambien. There is ahx of alcohol abuse with remote hx of 3 DUIs.He now onlydrinks approx. 4 standard drinksper week. He has been on Cialis for erectile dysfunction.    Visit Diagnosis:    ICD-10-CM   1. PTSD (post-traumatic stress disorder)  F43.10   2. Adult ADHD  F90.9     Past Psychiatric History: Please see intake H&P.  Past Medical History:  Past Medical History:  Diagnosis Date  . ADHD (attention deficit hyperactivity disorder)   . PTSD (post-traumatic stress disorder)     Past Surgical History:  Procedure Laterality Date  . ANKLE SURGERY       Family Psychiatric History: None.  Family History:  Family History  Problem Relation Age of Onset  . Diabetes Father     Social History:  Social History   Socioeconomic History  . Marital status: Single    Spouse name: Not on file  . Number of children: 1  . Years of education: Not on file  . Highest education level: Not on file  Occupational History  . Not on file  Tobacco Use  . Smoking status: Never Smoker  . Smokeless tobacco: Never Used  Vaping Use  . Vaping Use: Never used  Substance and Sexual Activity  . Alcohol use: Yes    Alcohol/week: 4.0 standard drinks    Types: 4 Shots of liquor per week  . Drug use: Not Currently    Types: Marijuana  . Sexual activity: Yes  Other Topics Concern  . Not on file  Social History Narrative  . Not on file   Social Determinants of Health   Financial Resource Strain: Not on file  Food Insecurity: Not on file  Transportation Needs: Not on file  Physical Activity: Not on file  Stress: Not on file  Social Connections: Not on file    Allergies: No Known Allergies  Metabolic Disorder Labs: Lab Results  Component Value Date   HGBA1C 5.6 06/30/2015   MPG 114 06/30/2015   No results found for: PROLACTIN Lab Results  Component Value Date   CHOL 237 (H) 06/30/2015   TRIG 97 06/30/2015   HDL 51 06/30/2015   CHOLHDL 4.6 06/30/2015   VLDL 19 06/30/2015   LDLCALC 167 (H)  06/30/2015   LDLCALC 179 (H) 02/03/2013   Lab Results  Component Value Date   TSH 2.178 06/30/2015   TSH 2.815 02/03/2013    Therapeutic Level Labs: No results found for: LITHIUM No results found for: VALPROATE No components found for:  CBMZ  Current Medications: Current Outpatient Medications  Medication Sig Dispense Refill  . clotrimazole-betamethasone (LOTRISONE) cream Apply 1 application topically 2 (two) times daily. 45 g 1  . [START ON 02/26/2021] dextroamphetamine (DEXTROSTAT) 10 MG tablet Take 2.5 tablets (25 mg total) by mouth 2  (two) times daily with breakfast and lunch. 150 tablet 0  . [START ON 01/26/2021] dextroamphetamine (DEXTROSTAT) 10 MG tablet Take 2.5 tablets (25 mg total) by mouth 2 (two) times daily with breakfast and lunch. 150 tablet 0  . dextroamphetamine (DEXTROSTAT) 10 MG tablet Take 2.5 tablets (25 mg total) by mouth 2 (two) times daily with breakfast and lunch. 150 tablet 0  . Lorcaserin HCl ER (BELVIQ XR) 20 MG TB24 Take 1 tablet by mouth daily. 30 tablet 3  . meloxicam (MOBIC) 15 MG tablet One tab PO qAM with breakfast for 2 weeks, then daily prn pain. 30 tablet 3  . prazosin (MINIPRESS) 1 MG capsule Take 1 capsule (1 mg total) by mouth at bedtime. 90 capsule 1  . tadalafil (CIALIS) 20 MG tablet Take 1 tablet (20 mg total) by mouth daily as needed for erectile dysfunction. 10 tablet 2  . traMADol (ULTRAM) 50 MG tablet 1-2 tabs by mouth Q8 hours, maximum 6 tabs per day. 40 tablet 0  . zolpidem (AMBIEN CR) 12.5 MG CR tablet Take 1 tablet (12.5 mg total) by mouth at bedtime as needed for sleep. 30 tablet 2   No current facility-administered medications for this visit.     Psychiatric Specialty Exam: Review of Systems  Psychiatric/Behavioral: Positive for decreased concentration and sleep disturbance.  All other systems reviewed and are negative.   There were no vitals taken for this visit.There is no height or weight on file to calculate BMI.  General Appearance: NA  Eye Contact:  NA  Speech:  Clear and Coherent and Normal Rate  Volume:  Normal  Mood:  Anxious  Affect:  NA  Thought Process:  Goal Directed  Orientation:  Full (Time, Place, and Person)  Thought Content: Logical   Suicidal Thoughts:  No  Homicidal Thoughts:  No  Memory:  Immediate;   Good Recent;   Good Remote;   Good  Judgement:  Fair  Insight:  Fair  Psychomotor Activity:  NA  Concentration:  Concentration: Fair  Recall:  Good  Fund of Knowledge: Good  Language: Good  Akathisia:  Negative  Handed:  Right  AIMS (if  indicated): not done  Assets:  Communication Skills Desire for Improvement Financial Resources/Insurance Housing Resilience  ADL's:  Intact  Cognition: WNL  Sleep:  Fair    Assessment and Plan: 49yo AAMwith hx of treatment forPTSD sx (anger/irritability, intrusive memories, nightmares, poor sleep) 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 He has not been seen since  August last year (missed appointments) and run out of zolpidem and dextrostat.  He reports good, stable mood, normal appetite but predicatbly increased problems with sleep and distractibility. He takes prazosin for nightmares. He has a hx of treatment for ADHD at [facility]. He was prescribed Vyvanse and then dexedrine. He has been complaining about erectile problems ever sincewe addedAddarall (hethought hedid not have those on Dexedrine so it was switched back  to that medication. He also was at one time on escitalopram for PTSD butit was not helpful.He haddifficulty with middle insomnia - but this has resolved with CR Ambien. There is ahx of alcohol abuse with remote hx of 3 DUIs.He now onlydrinks approx. 4 standard drinksper week. He has been on Cialis for erectile dysfunction.   Dx: PTSD chronic; Adult ADHD;Erectile dysfunction  Plan:Continue prazosin, resume Ambien CR,dexedrine25mg  bid.Next appointment in21months. The plan was discussed with patient who had an opportunity to ask questions and these were all answered. I spend20 minutes inphone consultation with the patient.     Magdalene Patricia, MD 12/29/2020, 1:45 PM

## 2021-01-02 ENCOUNTER — Encounter (HOSPITAL_COMMUNITY): Payer: Self-pay

## 2021-01-19 ENCOUNTER — Telehealth (HOSPITAL_COMMUNITY): Payer: Self-pay | Admitting: *Deleted

## 2021-01-19 ENCOUNTER — Other Ambulatory Visit (HOSPITAL_COMMUNITY): Payer: Self-pay | Admitting: Psychiatry

## 2021-01-19 MED ORDER — TADALAFIL 20 MG PO TABS: 20.0000 mg | ORAL_TABLET | Freq: Every day | ORAL | 2 refills | Status: AC | PRN

## 2021-01-19 NOTE — Telephone Encounter (Signed)
Pt called requesting refil of the Cialis. Pt has an upcoming appointment on 02/03/21.

## 2021-01-19 NOTE — Telephone Encounter (Signed)
Done

## 2021-01-25 ENCOUNTER — Encounter (HOSPITAL_COMMUNITY): Payer: Self-pay

## 2021-01-25 ENCOUNTER — Telehealth (HOSPITAL_COMMUNITY): Payer: Self-pay

## 2021-01-25 NOTE — Telephone Encounter (Signed)
Spoke with patient and he stated that he hasn't gotten his Dextroamphetamine 10mg . It was sent in 12/29/20, 01/26/21, and 02/26/21. I checked and they were sent to [facility]. Patient would like them resent to [facility] on [address]/[place]. Followup scheduled for 3/25. Thank you

## 2021-01-26 ENCOUNTER — Other Ambulatory Visit (HOSPITAL_COMMUNITY): Payer: Self-pay | Admitting: Psychiatry

## 2021-01-26 MED ORDER — DEXTROAMPHETAMINE SULFATE 10 MG PO TABS: 25.0000 mg | ORAL_TABLET | Freq: Two times a day (BID) | ORAL | 0 refills | Status: DC

## 2021-01-26 NOTE — Telephone Encounter (Signed)
Done

## 2021-02-03 ENCOUNTER — Other Ambulatory Visit: Payer: Self-pay

## 2021-02-03 ENCOUNTER — Telehealth (HOSPITAL_COMMUNITY): Payer: Self-pay | Admitting: Psychiatry

## 2021-03-13 ENCOUNTER — Other Ambulatory Visit: Payer: Self-pay

## 2021-03-13 ENCOUNTER — Telehealth (INDEPENDENT_AMBULATORY_CARE_PROVIDER_SITE_OTHER): Payer: Self-pay | Admitting: Psychiatry

## 2021-03-13 DIAGNOSIS — F909 Attention-deficit hyperactivity disorder, unspecified type: Secondary | ICD-10-CM

## 2021-03-13 NOTE — Progress Notes (Signed)
BH MD/PA/NP OP Progress Note  03/13/2021 11:21 AM Jonathon Patterson  MRN:  [id number] Interview was conducted by phone and I verified that I was speaking with the correct person using two identifiers. I discussed the limitations of evaluation and management by telemedicine and  the availability of in person appointments. Patient expressed understanding and agreed to proceed. Participants in the visit: patient (location - home); physician (location - clinic office). 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 He also reports history of difficulty focusing /concentrating and has been diagnosed with ADHD in the past . He has a past history of Alcohol Abuse in binges, with a history of past DUIs..  Currently reports he has been doing " all right" and in general reports his mood has improved over recent weeks /monhts.  Reports he is functioning well in daily activitites ( lives with his GF, not employed, on [place] disability).  He reports sleep as fair and continues to have intermittent nightmares .He continues to have some intrusive memories but  improved compared to prior . He reports focus and concentration improved on stimulant medication, and reitreates that difficulty focusing has been a persistent problem for him. Of note he denies history of TBI.  He describes intermittent episodes of irritability ( but denies any physical violence ) , which in the past have caused disruption to his life . 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  He states he feels he has  gradually improved, become better at controlling his anger, and feels he has become more aware of his emotions and triggers . With regards to past history of PTSD, we reviewed prior medication regimens that might have been tried . He  currently does not remember if he has taken SSRI or other antidepressants in the past for this condition.  Denies medication side effects. We reviewed medication regimen and side effects, to include potential for HTN and other cardiovascular side effects associated with stimulant medication ( reports he has periodically had BP taken and has been within normal) , and the potential for stimulant medication  to negatively affect sleep and anxiety states,, possibly have a negative impact on hypervigilance symptoms. Currently, states he does not feel stimulant has negatively affected anxiety, PTSD symptoms, which as above he describes as partially improved  .   We also reviewed potential for dizziness, lightheadedness of Prazosin and excessive sedation and other side effects associated with Ambien, which he states he takes irregularly. At this time reports he does not need refills  With regards to alcohol , reports he continues to drink but less than before. He acknowledges that at times alcohol consumption contributes to increased irritability.   Visit Diagnosis:  PTSD by history ADHD by history  Past Psychiatric History: Please see intake H&P.  Past Medical History:  Past Medical History:  Diagnosis Date  . ADHD (attention deficit hyperactivity disorder)   . PTSD (post-traumatic stress disorder)     Past Surgical History:  Procedure Laterality Date  . ANKLE SURGERY      Family Psychiatric History: None.  Family  History:  Family History  Problem Relation Age of Onset  . Diabetes Father     Social History:  Social History   Socioeconomic History  . Marital status: Single    Spouse name: Not on file  . Number of children: 1  . Years of education: Not  on file  . Highest education level: Not on file  Occupational History  . Not on file  Tobacco Use  . Smoking status: Never Smoker  . Smokeless tobacco: Never Used  Vaping Use  . Vaping Use: Never used  Substance and Sexual Activity  . Alcohol use: Yes    Alcohol/week: 4.0 standard drinks    Types: 4 Shots of liquor per week  . Drug use: Not Currently    Types: Marijuana  . Sexual activity: Yes  Other Topics Concern  . Not on file  Social History Narrative  . Not on file   Social Determinants of Health   Financial Resource Strain: Not on file  Food Insecurity: Not on file  Transportation Needs: Not on file  Physical Activity: Not on file  Stress: Not on file  Social Connections: Not on file    Allergies: No Known Allergies  Metabolic Disorder Labs: Lab Results  Component Value Date   HGBA1C 5.6 06/30/2015   MPG 114 06/30/2015   No results found for: PROLACTIN Lab Results  Component Value Date   CHOL 237 (H) 06/30/2015   TRIG 97 06/30/2015   HDL 51 06/30/2015   CHOLHDL 4.6 06/30/2015   VLDL 19 06/30/2015   LDLCALC 167 (H) 06/30/2015   LDLCALC 179 (H) 02/03/2013   Lab Results  Component Value Date   TSH 2.178 06/30/2015   TSH 2.815 02/03/2013    Therapeutic Level Labs: No results found for: LITHIUM No results found for: VALPROATE No components found for:  CBMZ  Current Medications: Current Outpatient Medications  Medication Sig Dispense Refill  . clotrimazole-betamethasone (LOTRISONE) cream Apply 1 application topically 2 (two) times daily. 45 g 1  . [START ON 03/28/2021] dextroamphetamine (DEXTROSTAT) 10 MG tablet Take 2.5 tablets (25 mg total) by mouth 2 (two) times daily with breakfast and lunch. 150 tablet 0  . dextroamphetamine (DEXTROSTAT) 10 MG tablet Take 2.5 tablets (25 mg total) by mouth 2 (two) times daily with breakfast and lunch. 150 tablet 0  . dextroamphetamine (DEXTROSTAT) 10 MG tablet Take 2.5 tablets (25 mg total) by mouth 2 (two) times  daily with breakfast and lunch. 150 tablet 0  . Lorcaserin HCl ER (BELVIQ XR) 20 MG TB24 Take 1 tablet by mouth daily. 30 tablet 3  . meloxicam (MOBIC) 15 MG tablet One tab PO qAM with breakfast for 2 weeks, then daily prn pain. 30 tablet 3  . prazosin (MINIPRESS) 1 MG capsule Take 1 capsule (1 mg total) by mouth at bedtime. 90 capsule 1  . tadalafil (CIALIS) 20 MG tablet Take 1 tablet (20 mg total) by mouth daily as needed for erectile dysfunction. 10 tablet 2  . traMADol (ULTRAM) 50 MG tablet 1-2 tabs by mouth Q8 hours, maximum 6 tabs per day. 40 tablet 0  . zolpidem (AMBIEN CR) 12.5 MG CR tablet Take 1 tablet (12.5 mg total) by mouth at bedtime as needed for sleep. 30 tablet 2   No current facility-administered medications for this visit.     Psychiatric Specialty Exam: Review of Systems  Psychiatric/Behavioral: Positive for decreased concentration and sleep disturbance.  All other systems reviewed and are negative.   There were no  vitals taken for this visit.There is no height or weight on file to calculate BMI.  General Appearance: NA  Eye Contact:  NA  Speech:  Normal   Volume:  Normal  Mood:  Reports mood as improved   Affect:  Appears reactive and appropriate, laughs appropriately at times during session  Thought Process:  Goal Directed  Orientation:  Full (Time, Place, and Person)  Thought Content: no hallucinations, no delusions expressed, denies suicidal or self injurious ideations, denies HI  Suicidal Thoughts:  No  Homicidal Thoughts:  No  Memory:  Recent and remote grossly intact   Judgement:  Fair  Insight:  Fair  Psychomotor Activity:  NA  Concentration:  Concentration: reports improved  and Attention Span: reports improved   Recall:  Good  Fund of Knowledge: Good  Language: Good  Akathisia:  Negative  Handed:  Right  AIMS (if indicated): not done  Assets:  Communication Skills Desire for Improvement Financial Resources/Insurance Housing Resilience  ADL's:   Intact  Cognition: WNL  Sleep:  Fair    Assessment and Plan:  49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 He also reports history of difficulty focusing /concentrating and has been diagnosed with ADHD in the past . He has a past history of Alcohol Abuse in binges, with a history of past DUIs.  Currently he reports he is doing okay and he is daily activities.  He does report some persistent/ongoing symptoms of PTSD such as intrusive memories and nightmares at times disrupting sleep.  Although acknowledges episodic irritability, denies any physical violence and states he has become better at identifying triggers and controlling his emotions He feels current medications have helped concentration/focus, denies side effects ( does report decreased sexual desire, but not sure this is related to current medication regimen) .   Dx: PTSD chronic; Adult ADHD  Plan:  Reports partial improvement in symptoms and states that currently he is doing well in daily activities. Does not endorse  medication side effects.   I have reviewed medication options for management of PTSD, such as SSRI or other antidepressant, as well as potential anxiogenic/sympathetic/cardiovascular side effects of stimulant medication, risk of dizziness / lightheadedness/orthostasis associated with prazosin and  and risk of excessive sedation related to Ambien.   At this time stated preference is not to  start new medication- for now continue current regimen. Continue to review progress/medication response   Does not currently require refills   Will see in 4-6 weeks, agrees to contact clinic sooner if any worsening prior or medication concerns    Craige Cotta, MD 03/13/2021, 11:21 AM

## 2021-03-14 ENCOUNTER — Telehealth: Payer: Self-pay | Admitting: Psychiatry

## 2021-03-14 ENCOUNTER — Telehealth (HOSPITAL_COMMUNITY): Payer: Self-pay | Admitting: Psychiatry

## 2021-03-14 NOTE — Telephone Encounter (Signed)
9:12am 03/14/21 Called patient due to [facility] insurance is not verifying - Called [employer] [place] [employer] at [phone] ext [phone] - s/w rep: Vernona Rieger according to Vernona Rieger the patient benefits to cover MH visits expired on 01/05/21 - (benefits were 11/13/19 -01/05/2021) in order to get the pt's visit on 03/13/21 paid for the patient will have to call his primary doctor at [facility],  Dr. Reita May #[phone] ext [phone]  In order to get authorizations for visits.   I  Nettie Elm H. ) called to informed the patient but his mailbox is full.Marland KitchenMarguerite Olea

## 2021-03-15 ENCOUNTER — Telehealth (HOSPITAL_COMMUNITY): Payer: Self-pay | Admitting: Psychiatry

## 2021-04-04 ENCOUNTER — Telehealth (HOSPITAL_COMMUNITY): Payer: Self-pay | Admitting: Psychiatry

## 2021-04-24 ENCOUNTER — Telehealth (HOSPITAL_COMMUNITY): Payer: Non-veteran care | Admitting: Psychiatry

## 2021-05-01 ENCOUNTER — Telehealth: Payer: Self-pay | Admitting: General Practice

## 2021-05-01 NOTE — Telephone Encounter (Signed)
Transition Care Management Follow-up Telephone Call Date of discharge and from where: [facility] 04/29/21 How have you been since you were released from the hospital? Left without seen.  Any questions or concerns? No Patient has been discharged from the practice.

## 2021-05-22 ENCOUNTER — Encounter (HOSPITAL_COMMUNITY): Payer: Self-pay | Admitting: Psychiatry

## 2021-05-22 ENCOUNTER — Other Ambulatory Visit: Payer: Self-pay

## 2021-05-22 ENCOUNTER — Other Ambulatory Visit (HOSPITAL_COMMUNITY): Payer: Self-pay | Admitting: Psychiatry

## 2021-05-22 ENCOUNTER — Telehealth (INDEPENDENT_AMBULATORY_CARE_PROVIDER_SITE_OTHER): Payer: Non-veteran care | Admitting: Psychiatry

## 2021-05-22 DIAGNOSIS — F4310 Post-traumatic stress disorder, unspecified: Secondary | ICD-10-CM

## 2021-05-22 MED ORDER — DEXTROAMPHETAMINE SULFATE 10 MG PO TABS: 10.0000 mg | ORAL_TABLET | Freq: Two times a day (BID) | ORAL | 0 refills | Status: DC

## 2021-05-22 MED ORDER — SERTRALINE HCL 25 MG PO TABS: 25.0000 mg | ORAL_TABLET | Freq: Every day | ORAL | 1 refills | Status: DC

## 2021-05-22 NOTE — Progress Notes (Signed)
BH MD/PA/NP OP Progress Note  05/22/2021 2:14 PM Jonathon Patterson  MRN:  [id number] Interview was conducted by phone and I verified that I was speaking with the correct person using two identifiers. I discussed the limitations of evaluation and management by telemedicine and  the availability of in person appointments. Patient expressed understanding and agreed to proceed. Participants in the visit: patient (location - home); physician (location - clinic office). 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 He also reports history of difficulty focusing /concentrating and has been diagnosed with ADHD in the past . He has a past history of Alcohol Abuse in binges, with a history of past DUIs.. Patient is a 50 year old male.  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  History of PTSD symptoms.  Primarily reported intrusive memories, some avoidance symptoms and irritability.  He also has been diagnosed with ADHD and reports a history of difficulty focusing/concentrating.  He has a history of intermittent alcohol use, mainly in binges.  Today reports he drinks up to 3-4 shots of liquor 2-3 times a week.  He denies history of withdrawal symptoms and states he sometimes does not drink for several days or even weeks without any symptoms of withdrawal. He reports he is in good physical condition/fit and exercises regularly. With regards to irritability he reports mainly brief episodes of anger/explosiveness sometimes directed at his girlfriend. (Of note, he reports that he does own a firearm but has locked  away and does not have access to it at this time.)   He has been prescribed dextroamphetamine at 25 mg twice daily, for ADHD.  He was also prescribed Minipress 1 mg nightly for nightmares, and Ambien CR 12.5 mg nightly for insomnia.  He states he has decreased dextroamphetamine doses, sometimes taking 20 mg twice daily .  He reports he is off Minipress and off Ambien as he felt they were not working.  He does not remember having been on any SSRI or other psychiatric medication for PTSD in the past.  Denies medication side effects.  He does report some sexual dysfunction , impotence, which has affected relationships in the past.  States this started about 2 years ago.  Sometimes takes Cialis with partial improvement.  We reviewed medication issues at length.  He reports dextroamphetamine has been effective in helping him focus better and does not endorse side effects.  We reviewed potential side effects to include misuse/abuse potential, potential cardiovascular side effects, possible contribution to irritability/aggression, and possible contribution to reported sexual side effects.  We also reviewed the potential for alcohol as a contributor to sexual dysfunction.  He reports mood has been vaguely depressed, describes it as a 3/10 with 10 being best.  Endorses some degree of anhedonia.  Sleep is variable.  Appetite is good.  Denies any suicidal ideations and remains future oriented.  No psychotic symptoms or hallucinations endorsed or noted.  PTSD symptoms as above have fluctuated over time, currently describes mainly a tendency towards avoidance/limiting social contacts and some intermittent intrusive memories.  Visit Diagnosis:  PTSD by history ADHD by history  Past  Psychiatric History: Please see intake H&P.  Past Medical History:  Past Medical History:  Diagnosis Date   ADHD (attention deficit hyperactivity disorder)    PTSD (post-traumatic stress disorder)     Past Surgical History:   Procedure Laterality Date   ANKLE SURGERY      Family Psychiatric History: None.  Family History:  Family History  Problem Relation Age of Onset   Diabetes Father     Social History:  Social History   Socioeconomic History   Marital status: Single    Spouse name: Not on file   Number of children: 1   Years of education: Not on file   Highest education level: Not on file  Occupational History   Not on file  Tobacco Use   Smoking status: Never   Smokeless tobacco: Never  Vaping Use   Vaping Use: Never used  Substance and Sexual Activity   Alcohol use: Yes    Alcohol/week: 4.0 standard drinks    Types: 4 Shots of liquor per week   Drug use: Not Currently    Types: Marijuana   Sexual activity: Yes  Other Topics Concern   Not on file  Social History Narrative   Not on file   Social Determinants of Health   Financial Resource Strain: Not on file  Food Insecurity: Not on file  Transportation Needs: Not on file  Physical Activity: Not on file  Stress: Not on file  Social Connections: Not on file    Allergies: No Known Allergies  Metabolic Disorder Labs: Lab Results  Component Value Date   HGBA1C 5.6 06/30/2015   MPG 114 06/30/2015   No results found for: PROLACTIN Lab Results  Component Value Date   CHOL 237 (H) 06/30/2015   TRIG 97 06/30/2015   HDL 51 06/30/2015   CHOLHDL 4.6 06/30/2015   VLDL 19 06/30/2015   LDLCALC 167 (H) 06/30/2015   LDLCALC 179 (H) 02/03/2013   Lab Results  Component Value Date   TSH 2.178 06/30/2015   TSH 2.815 02/03/2013    Therapeutic Level Labs: No results found for: LITHIUM No results found for: VALPROATE No components found for:  CBMZ  Current Medications: Current Outpatient Medications  Medication Sig Dispense Refill   clotrimazole-betamethasone (LOTRISONE) cream Apply 1 application topically 2 (two) times daily. 45 g 1   dextroamphetamine (DEXTROSTAT) 10 MG tablet Take 2.5 tablets (25 mg total) by mouth 2  (two) times daily with breakfast and lunch. 150 tablet 0   dextroamphetamine (DEXTROSTAT) 10 MG tablet Take 2.5 tablets (25 mg total) by mouth 2 (two) times daily with breakfast and lunch. 150 tablet 0   dextroamphetamine (DEXTROSTAT) 10 MG tablet Take 2.5 tablets (25 mg total) by mouth 2 (two) times daily with breakfast and lunch. 150 tablet 0   Lorcaserin HCl ER (BELVIQ XR) 20 MG TB24 Take 1 tablet by mouth daily. 30 tablet 3   meloxicam (MOBIC) 15 MG tablet One tab PO qAM with breakfast for 2 weeks, then daily prn pain. 30 tablet 3   prazosin (MINIPRESS) 1 MG capsule Take 1 capsule (1 mg total) by mouth at bedtime. 90 capsule 1   tadalafil (CIALIS) 20 MG tablet Take 1 tablet (20 mg total) by mouth daily as needed for erectile dysfunction. 10 tablet 2   traMADol (ULTRAM) 50 MG tablet 1-2 tabs by mouth Q8 hours, maximum 6 tabs per day. 40 tablet 0   zolpidem (AMBIEN CR) 12.5 MG CR tablet Take 1 tablet (12.5 mg total)  by mouth at bedtime as needed for sleep. 30 tablet 2   No current facility-administered medications for this visit.     Psychiatric Specialty Exam: Note difficulty in obtaining a full mental side exam in the context of phone communication Review of Systems  Psychiatric/Behavioral:  Positive for decreased concentration, dysphoric mood, sleep disturbance and suicidal ideas. The patient is hyperactive.   All other systems reviewed and are negative.  There were no vitals taken for this visit.There is no height or weight on file to calculate BMI.  General Appearance: NA  Eye Contact:  NA  Speech:  Normal   Volume:  Normal  Mood:  Reports mood as improved   Affect:  Appears reactive and appropriate, laughs appropriately at times during session  Thought Process:  Goal Directed  Orientation:  Full (Time, Place, and Person)  Thought Content: no hallucinations, no delusions expressed, denies suicidal or self injurious ideations, denies any homicidal or violent ideations and  specifically denies any violent thoughts towards his girlfriend  Suicidal Thoughts:  No  Homicidal Thoughts:  No  Memory:  Recent and remote grossly intact   Judgement:  Fair  Insight:  Fair  Psychomotor Activity:  NA  Concentration:  Concentration: reports improved  and Attention Span: reports improved   Recall:  Good  Fund of Knowledge: Good  Language: Good  Akathisia:  Negative  Handed:  Right  AIMS (if indicated): not done  Assets:  Communication Skills Desire for Improvement Financial Resources/Insurance Housing Resilience  ADL's:  Intact  Cognition: WNL  Sleep:  Fair    Assessment and Plan:  50 year old male, combat veteran, past history of PTSD diagnoses, has also been diagnosed with ADHD.  Prescribed dextroamphetamine which he reports he is tolerating well.  Was also prescribed prazosin and Ambien CR which she states he is no longer taking.  Endorses some depressive symptoms to include a vague but persistent sense of anhedonia and some sadness.  No SI.  He also reports brief episodes of irritability/angry outbursts.  At this time denies any homicidal or violent ideations towards anyone.  Endorses some persistent PTSD symptoms, primarily intermittent intrusive memories and some avoidance.  He also drinks regularly, most often in a binge like fashion.  Denies any history of significant withdrawal symptoms.  Endorses some sexual dysfunction which may be related to alcohol or even dextroamphetamine  We discussed treatment options.  As above reviewed potential side effects related to current medication regimen.  We discussed adding an antidepressant medication and he agreed.  Reviewed different options and agrees to Zoloft.  We did review that SSRIs can also be associated with sexual dysfunction.  However based on currently describe depression and history of PTSD an SSRI trial may be warranted.  I encouraged him to consider tapering down or off dextroamphetamine based on above  considerations and utilizing motivational interviewing techniques have reviewed the negative impact that alcohol may be having on his relationships and level of functioning.Marland Kitchen.   Dx: PTSD chronic; Adult ADHD   Plan:   Start Zoloft at 25 mg daily initially.  Side effects reviewed. Taper dextroamphetamine to 10 mg twice daily and consider gradually tapering off.   Encourage alcohol abstinence Offered to assist in referral for individual psychotherapy  Encourage patient to establish PCP treatment for medical monitoring/treatment as needed.  [place] agrees [place] [facility] [place] [place]. Will see in 4 to 6 weeks.  Agrees to contact clinic sooner should there be any worsening or concern prior.  Craige Cotta, MD 05/22/2021, 2:14 PM

## 2021-07-18 ENCOUNTER — Encounter (HOSPITAL_COMMUNITY): Payer: Self-pay | Admitting: Psychiatry

## 2021-07-18 ENCOUNTER — Telehealth (INDEPENDENT_AMBULATORY_CARE_PROVIDER_SITE_OTHER): Payer: No Typology Code available for payment source | Admitting: Psychiatry

## 2021-07-18 ENCOUNTER — Other Ambulatory Visit: Payer: Self-pay

## 2021-07-18 DIAGNOSIS — F4310 Post-traumatic stress disorder, unspecified: Secondary | ICD-10-CM

## 2021-07-18 MED ORDER — SERTRALINE HCL 50 MG PO TABS: 50.0000 mg | ORAL_TABLET | Freq: Every day | ORAL | 1 refills | Status: DC

## 2021-07-18 MED ORDER — DEXTROAMPHETAMINE SULFATE 10 MG PO TABS: 10.0000 mg | ORAL_TABLET | Freq: Two times a day (BID) | ORAL | 0 refills | Status: DC

## 2021-07-18 NOTE — Progress Notes (Signed)
BH MD/PA/NP OP Progress Note  07/18/2021 10:32 AM Jonathon Patterson  MRN:  [id number] Interview was conducted by phone and I verified that I was speaking with the correct person using two identifiers. I discussed the limitations of evaluation and management by telemedicine and  the availability of in person appointments. Patient expressed understanding and agreed to proceed. Participants in the visit: patient (location - home); physician (location - clinic office). 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 He also reports history of difficulty focusing /concentrating and has been diagnosed with ADHD in the past . He has a past history of Alcohol Abuse in binges, with a history of past DUIs..   Reports he has been doing "OK".  He reports he is making effort in having a daily routine /structure and that he is going to the gym regularly . He states his mood has been " about the same".  Currently does not endorse overt PTSD symptoms but does report history of hypervigilance, decreased sleep, and a feeling of irritability.  He continues to drink several times a week. We reviewed alcohol consumption patter further- reports he mainly drinks tequila and on average a bottle will last 3-4 days . He states he feels alcohol makes him " more mellow" most of the time but acknowledges there has been tension with his GF due to his drinking. Does not answer other CAGE questions affirmatively , and states he does not drink in AM, reporting " I start drinking at around 5pm or 6pm".  Denies blackouts, denies WDL seizures, and  denies having WDL symptoms when he does not drink. States he sometimes goes days to week without drinking and does not notice any WDL. Using motivational techniques, we reviewed negative impact alcohol can have on physical health, negative impact it can have on irritability/impulsivity. Patient reports he is not ready to consider abstinence but is planning on decreasing frequency of alcohol consumption. Denies medication side effects. Thus far has tolerated medications well . Reports history of some sexual dysfunction even prior to Zoloft trial and does not feel Sertraline has caused further dysfunction.  Denies suicidal ideations . Denies HI. No psychotic symptoms.    Visit Diagnosis:  PTSD by history ADHD by history  Past Psychiatric History: Please see intake H&P.  Past Medical History:  Past Medical History:  Diagnosis Date   ADHD (attention deficit hyperactivity disorder)    PTSD (post-traumatic stress disorder)     Past Surgical History:  Procedure Laterality Date   ANKLE SURGERY      Family Psychiatric History: None.  Family History:  Family History  Problem Relation Age of Onset   Diabetes Father     Social History:  Social History   Socioeconomic History   Marital status: Single    Spouse name: Not on file   Number of children: 1   Years of education: Not on file   Highest education level: Not on file  Occupational History   Not on file  Tobacco Use   Smoking status: Never   Smokeless tobacco: Never  Vaping Use   Vaping Use: Never used  Substance and Sexual Activity   Alcohol use: Yes    Alcohol/week: 4.0 standard drinks    Types: 4 Shots of liquor per week   Drug use: Not Currently    Types: Marijuana   Sexual activity: Yes  Other Topics Concern   Not on file  Social History Narrative   Not on file   Social Determinants of Health   Financial Resource Strain: Not on file  Food Insecurity: Not on file  Transportation Needs: Not on file   Physical Activity: Not on file  Stress: Not on file  Social Connections: Not on file    Allergies: No Known Allergies  Metabolic Disorder Labs: Lab Results  Component Value Date   HGBA1C 5.6 06/30/2015   MPG 114 06/30/2015   No results found for: PROLACTIN Lab Results  Component Value Date   CHOL 237 (H) 06/30/2015   TRIG 97 06/30/2015   HDL 51 06/30/2015   CHOLHDL 4.6 06/30/2015   VLDL 19 06/30/2015   LDLCALC 167 (H) 06/30/2015   LDLCALC 179 (H) 02/03/2013   Lab Results  Component Value Date   TSH 2.178 06/30/2015   TSH 2.815 02/03/2013    Therapeutic Level Labs: No results found for: LITHIUM No results found for: VALPROATE No components found for:  CBMZ  Current Medications: Current Outpatient Medications  Medication Sig Dispense Refill   clotrimazole-betamethasone (LOTRISONE) cream Apply 1 application topically 2 (two) times daily. 45 g 1   dextroamphetamine (DEXTROSTAT) 10 MG tablet Take 1 tablet (10 mg total) by mouth 2 (two) times daily with breakfast and lunch. 60 tablet 0   meloxicam (MOBIC) 15 MG tablet One tab PO qAM with breakfast for 2 weeks, then daily prn pain. 30 tablet 3   sertraline (ZOLOFT) 25 MG tablet Take 1 tablet (25 mg total) by mouth daily. 30 tablet 1   tadalafil (CIALIS) 20 MG tablet Take 1 tablet (20 mg total) by mouth daily as needed for erectile dysfunction. 10 tablet 2   No current facility-administered medications for this visit.     Psychiatric Specialty Exam: Note difficulty in obtaining a full mental side exam in the context of phone communication Review of Systems  Psychiatric/Behavioral:  Positive for decreased concentration, dysphoric mood, sleep disturbance and suicidal ideas. The patient is hyperactive.   All other systems reviewed and are negative.  There were no vitals taken for this visit.There is no height or weight on file to calculate BMI.  General Appearance: NA  Eye Contact:  NA  Speech:  Normal   Volume:  Normal   Mood:  Reports mood as stable , denies depression  Affect:  Appears reactive   Thought Process:  Goal Directed  Orientation:  Full (Time, Place, and Person)  Thought Content: denies hallucinations, no delusions , denies SI, denies HI or violent ideations towards anyone , states " only if someone hurts me first "  Suicidal Thoughts:  No  Homicidal Thoughts:  No  Memory:  Recent and remote grossly intact   Judgement:  Fair  Insight:  Fair  Psychomotor Activity:  NA  Concentration:  Concentration: reports improved  and Attention Span: reports improved   Recall:  Good  Fund of Knowledge: Good  Language: Good  Akathisia:  Negative  Handed:  Right  AIMS (if indicated): not done  Assets:  Communication Skills Desire for Improvement Financial Resources/Insurance Housing Resilience  ADL's:  Intact  Cognition: WNL  Sleep:  Fair    Assessment and Plan:  50 year old male,  combat veteran, past history of PTSD diagnoses, has also been diagnosed with ADHD.    Generally doing well, although reports some ongoing /chronic symptoms such as hypervigilance, vague irritability, avoidance . He describes significant alcohol consumption , and describes drinking one bottle of tequila every three days . Denies history of blackouts, seizures, DUIs, or DTs and denies having had withdrawal symptoms when discontinues alcohol for several days.  Aware that regular/daily alcohol use is detrimental to his physical, mental health and that it is affecting relationship with GF . He also endorses history of angry outbursts and we have reviewed increased likelihood of impulsivity , anger with ETOH.  Of note, patient reports he has a firearm, but keeps it locked away.  Thus far tolerating Zoloft well . He is also on dextroampheatmine for history of ADHD symptomatology. Denies side effects. I have encouraged gradual taper based on potential side effects . Patient does not currently feel stimulant medication is worsening  or exacerbating PTSD symptoms or irritability. At this time would prefer to continue current dose .    Dx: PTSD chronic; Adult ADHD   Plan:   Increase Zoloft to 50 mg daily Continue dextroamphetamine 10 mg twice daily and continue efforts to gradually taper off if possible  Continued to encourage  alcohol abstinence Offered to assist in referral for individual psychotherapy  He does state he has an appointment with newly assigned PCP this month ( through the [place]) for medical check up Offered referral to [facility] to help address alcohol use disorder  Will see in 4 to 6 weeks.   Agrees to contact clinic sooner should there be any worsening or concern prior.    Craige Cotta, MD 07/18/2021, 10:32 AM

## 2021-08-28 ENCOUNTER — Encounter (HOSPITAL_COMMUNITY): Payer: Self-pay

## 2021-08-28 ENCOUNTER — Telehealth (HOSPITAL_COMMUNITY): Payer: No Typology Code available for payment source | Admitting: Psychiatry

## 2021-08-28 ENCOUNTER — Other Ambulatory Visit: Payer: Self-pay

## 2021-08-28 NOTE — Progress Notes (Signed)
08/28/2021  9,55 AM  Attempted to contact patient via phone for scheduled phone based medication management appointment . No answer.   Made another attempt to reach at around 1,30 PM . Mail box full and unable to leave message at this time.   Will ask front desk staff to contact patient and reschedule appointment  Sallyanne Havers , MD

## 2021-08-28 NOTE — Progress Notes (Deleted)
BH MD/PA/NP OP Progress Note  08/28/2021 9:39 AM Jonathon Patterson  MRN:  [id number] Interview was conducted by phone and I verified that I was speaking with the correct person using two identifiers. I discussed the limitations of evaluation and management by telemedicine and  the availability of in person appointments. Patient expressed understanding and agreed to proceed. Participants in the visit: patient (location - home); physician (location - clinic office). 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 He also reports history of difficulty focusing /concentrating and has been diagnosed with ADHD in the past . He has a past history of Alcohol Abuse in binges, with a history of past DUIs..   Reports he has been doing "OK".  He reports he is making effort in having a daily routine /structure and that he is going to the gym regularly . He states his mood has been " about the same".  Currently does not endorse overt PTSD symptoms but does report history of hypervigilance, decreased sleep, and a feeling of irritability.  He continues to drink several times a week. We reviewed alcohol consumption patter further- reports he mainly drinks tequila and on average a bottle will last 3-4 days . He states he feels alcohol makes him " more mellow" most of the time but acknowledges there has been tension with his GF due to his drinking. Does not answer other CAGE questions affirmatively , and states he does not drink in AM, reporting " I start drinking at around 5pm or 6pm".  Denies blackouts, denies WDL seizures, and  denies having WDL symptoms when he does not drink. States he sometimes goes days to week without drinking and does not notice any WDL. Using motivational techniques, we reviewed negative impact alcohol can have on physical health, negative impact it can have on irritability/impulsivity. Patient reports he is not ready to consider abstinence but is planning on decreasing frequency of alcohol consumption. Denies medication side effects. Thus far has tolerated medications well . Reports history of some sexual dysfunction even prior to Zoloft trial and does not feel Sertraline has caused further dysfunction.  Denies suicidal ideations . Denies HI. No psychotic symptoms.    Visit Diagnosis:  PTSD by history ADHD by history  Past Psychiatric History: Please see intake H&P.  Past Medical History:  Past Medical History:  Diagnosis Date   ADHD (attention deficit hyperactivity disorder)    PTSD (post-traumatic stress disorder)     Past Surgical History:  Procedure Laterality Date   ANKLE SURGERY      Family Psychiatric History: None.  Family History:  Family History  Problem Relation Age of Onset   Diabetes Father     Social History:  Social History   Socioeconomic History   Marital status: Single    Spouse name: Not on file   Number of children: 1   Years of education: Not on file   Highest education level: Not on file  Occupational History   Not on file  Tobacco Use   Smoking status: Never   Smokeless tobacco: Never  Vaping Use   Vaping Use: Never used  Substance and Sexual Activity   Alcohol use: Yes    Alcohol/week: 4.0 standard drinks    Types: 4 Shots of liquor per week   Drug use: Not Currently    Types: Marijuana   Sexual activity: Yes  Other Topics Concern   Not on file  Social History Narrative   Not on file   Social Determinants of Health   Financial Resource Strain: Not on file  Food Insecurity: Not on file  Transportation Needs: Not on file   Physical Activity: Not on file  Stress: Not on file  Social Connections: Not on file    Allergies: No Known Allergies  Metabolic Disorder Labs: Lab Results  Component Value Date   HGBA1C 5.6 06/30/2015   MPG 114 06/30/2015   No results found for: PROLACTIN Lab Results  Component Value Date   CHOL 237 (H) 06/30/2015   TRIG 97 06/30/2015   HDL 51 06/30/2015   CHOLHDL 4.6 06/30/2015   VLDL 19 06/30/2015   LDLCALC 167 (H) 06/30/2015   LDLCALC 179 (H) 02/03/2013   Lab Results  Component Value Date   TSH 2.178 06/30/2015   TSH 2.815 02/03/2013    Therapeutic Level Labs: No results found for: LITHIUM No results found for: VALPROATE No components found for:  CBMZ  Current Medications: Current Outpatient Medications  Medication Sig Dispense Refill   clotrimazole-betamethasone (LOTRISONE) cream Apply 1 application topically 2 (two) times daily. 45 g 1   dextroamphetamine (DEXTROSTAT) 10 MG tablet Take 1 tablet (10 mg total) by mouth 2 (two) times daily with breakfast and lunch. 60 tablet 0   sertraline (ZOLOFT) 50 MG tablet Take 1 tablet (50 mg total) by mouth daily. 30 tablet 1   tadalafil (CIALIS) 20 MG tablet Take 1 tablet (20 mg total) by mouth daily as needed for erectile dysfunction. 10 tablet 2   No current facility-administered medications for this visit.     Psychiatric Specialty Exam: Note difficulty in obtaining a full mental side exam in the context of phone communication Review of Systems  Psychiatric/Behavioral:  Positive for decreased concentration, dysphoric mood, sleep disturbance and suicidal ideas. The patient is hyperactive.   All other systems reviewed and are negative.  There were no vitals taken for this visit.There is no height or weight on file to calculate BMI.  General Appearance: NA  Eye Contact:  NA  Speech:  Normal   Volume:  Normal  Mood:  Reports mood as stable , denies depression  Affect:  Appears reactive   Thought Process:  Goal  Directed  Orientation:  Full (Time, Place, and Person)  Thought Content: denies hallucinations, no delusions , denies SI, denies HI or violent ideations towards anyone , states " only if someone hurts me first "  Suicidal Thoughts:  No  Homicidal Thoughts:  No  Memory:  Recent and remote grossly intact   Judgement:  Fair  Insight:  Fair  Psychomotor Activity:  NA  Concentration:  Concentration: reports improved  and Attention Span: reports improved   Recall:  Good  Fund of Knowledge: Good  Language: Good  Akathisia:  Negative  Handed:  Right  AIMS (if indicated): not done  Assets:  Communication Skills Desire for Improvement Financial Resources/Insurance Housing Resilience  ADL's:  Intact  Cognition: WNL  Sleep:  Fair    Assessment and Plan:  50 year old male, combat veteran, past history of PTSD diagnoses, has also been diagnosed with ADHD.    Generally doing well, although reports some ongoing /  chronic symptoms such as hypervigilance, vague irritability, avoidance . He describes significant alcohol consumption , and describes drinking one bottle of tequila every three days . Denies history of blackouts, seizures, DUIs, or DTs and denies having had withdrawal symptoms when discontinues alcohol for several days.  Aware that regular/daily alcohol use is detrimental to his physical, mental health and that it is affecting relationship with GF . He also endorses history of angry outbursts and we have reviewed increased likelihood of impulsivity , anger with ETOH.  Of note, patient reports he has a firearm, but keeps it locked away.  Thus far tolerating Zoloft well . He is also on dextroampheatmine for history of ADHD symptomatology. Denies side effects. I have encouraged gradual taper based on potential side effects . Patient does not currently feel stimulant medication is worsening or exacerbating PTSD symptoms or irritability. At this time would prefer to continue current dose .     Dx: PTSD chronic; Adult ADHD   Plan:   Increase Zoloft to 50 mg daily Continue dextroamphetamine 10 mg twice daily and continue efforts to gradually taper off if possible  Continued to encourage  alcohol abstinence Offered to assist in referral for individual psychotherapy  He does state he has an appointment with newly assigned PCP this month ( through the [place]) for medical check up Offered referral to [facility] to help address alcohol use disorder  Will see in 4 to 6 weeks.   Agrees to contact clinic sooner should there be any worsening or concern prior.    Craige Cotta, MD 08/28/2021, 9:39 AMPatient ID: Jonathon Patterson, male   DOB: 1971/04/30, 50 y.o.   MRN: [id number]

## 2021-09-25 ENCOUNTER — Encounter (HOSPITAL_COMMUNITY): Payer: Self-pay | Admitting: Psychiatry

## 2021-09-25 ENCOUNTER — Telehealth (HOSPITAL_BASED_OUTPATIENT_CLINIC_OR_DEPARTMENT_OTHER): Payer: No Typology Code available for payment source | Admitting: Psychiatry

## 2021-09-25 ENCOUNTER — Other Ambulatory Visit: Payer: Self-pay

## 2021-09-25 DIAGNOSIS — F4310 Post-traumatic stress disorder, unspecified: Secondary | ICD-10-CM

## 2021-09-25 MED ORDER — FLUOXETINE HCL 10 MG PO CAPS: 10.0000 mg | ORAL_CAPSULE | Freq: Every day | ORAL | 1 refills | Status: AC

## 2021-09-25 NOTE — Progress Notes (Signed)
Patient ID: Jonathon Patterson, male   DOB: 02-Jul-1971, 50 y.o.   MRN: [id number] [facility] MD/PA/NP OP Progress Note  09/25/2021 10:33 AM COBEY RAINERI  MRN:  [id number] Interview was conducted by phone and I verified that I was speaking with the correct person using two identifiers. I discussed the limitations of evaluation and management by telemedicine and  the availability of in person appointments. Patient expressed understanding and agreed to proceed. Participants in the visit: patient (location - home); physician (location - clinic office). 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 He also reports history of difficulty focusing /concentrating and has been diagnosed with ADHD in the past . He has a past history of Alcohol Abuse in binges, with a history of past DUIs..  Patient reports that he has been facing some increased stressors recently. Explains that he has had financial difficulties , and needed to rent his house to avoid losing it. He has now moved in with a cousin for the time being and at this time he is in [place], [place] with his cousin, although reports that his intention is to return to [facility] . He is on a [place] disability ( reports at " 70 %", which he states makes him unemployable, so that he is on a fixed income ). He is future oriented , and states he is working with an Pensions consultant and with a Clinical research associate to see if disability monies can be increased . He  reports he has had some anxiety and depression related to above stressors but denies suicidal ideations . Appetite "OK", Sleep fair, Energy level described  as variable  He reports he has been taking Zoloft at 50 mgr QDAY but has not noticed significant improvement. He denies side effects but has noted some decreased sexual libido, which may also be related in part to ETOH.  We discussed  medication options , to include titrating Zoloft dose up/adding another medication or switching to another antidepressant trial.  At this time his preference is to try another medication.  We reviewed options and he agrees to Fluoxetine trial , to address depression and history of PTSD symptoms Side effects reviewed . He reports some persistent PTSD type symptoms to include intrusive recollections and some hypervigilance .  Of note, he reports he stopped stimulant /dextroamphetamine a few weeks ago and that the only medication he is taking currently is Zoloft . He does not endorse any worsening symptoms  off stimulant medication We reviewed alcohol related issues . He reports he drinks 3-4 x per week, sometimes up to 3-4 shots of liquor per episode. He does not endorse withdrawal symptoms on days he does not drink and can go several days without drinking without WDL symptoms ensuing . He answers all CAGE questions negatively at this time. He denies history of seizures or of DTs. Denies blackouts . He does have a remote history of DUI.  Does not endorse drug abuse .  We reviewed negative impact that alcohol can have on physical health as well as deleterious effect on mood and anxiety symptoms.       Visit Diagnosis:  PTSD by history ADHD by history  Past Psychiatric History: Please see intake H&P.  Past Medical History:  Past Medical History:  Diagnosis Date   ADHD (attention deficit hyperactivity disorder)    PTSD (post-traumatic stress disorder)     Past Surgical History:  Procedure Laterality Date   ANKLE SURGERY      Family Psychiatric History: None.  Family History:  Family History  Problem Relation Age of Onset   Diabetes Father     Social  History:  Social History   Socioeconomic History   Marital status: Single    Spouse name: Not on file   Number of children: 1   Years of education: Not on file   Highest education level: Not on file  Occupational History   Not on file  Tobacco Use   Smoking status: Never   Smokeless tobacco: Never  Vaping Use   Vaping Use: Never used  Substance and Sexual Activity   Alcohol use: Yes    Alcohol/week: 4.0 standard drinks    Types: 4 Shots of liquor per week   Drug use: Not Currently    Types: Marijuana   Sexual activity: Yes  Other Topics Concern   Not on file  Social History Narrative   Not on file   Social Determinants of Health   Financial Resource Strain: Not on file  Food Insecurity: Not on file  Transportation Needs: Not on file  Physical Activity: Not on file  Stress: Not on file  Social Connections: Not on file    Allergies: No Known Allergies  Metabolic Disorder Labs: Lab Results  Component Value Date   HGBA1C 5.6 06/30/2015   MPG 114 06/30/2015   No results found for: PROLACTIN Lab Results  Component Value Date   CHOL 237 (H) 06/30/2015   TRIG 97 06/30/2015   HDL 51 06/30/2015   CHOLHDL 4.6 06/30/2015   VLDL 19 06/30/2015   LDLCALC 167 (H) 06/30/2015   LDLCALC 179 (H) 02/03/2013   Lab Results  Component Value Date   TSH 2.178 06/30/2015   TSH 2.815 02/03/2013    Therapeutic Level Labs: No results found for: LITHIUM No results found for: VALPROATE No components found for:  CBMZ  Current Medications: Current Outpatient Medications  Medication Sig Dispense Refill   clotrimazole-betamethasone (LOTRISONE) cream Apply 1 application topically 2 (two) times daily. 45 g 1   dextroamphetamine (DEXTROSTAT) 10 MG tablet Take 1 tablet (10 mg total) by mouth 2 (two) times daily with breakfast and lunch. 60 tablet 0   sertraline (ZOLOFT) 50 MG tablet Take 1 tablet (50 mg total) by mouth daily. 30 tablet 1   tadalafil (CIALIS) 20 MG tablet Take 1  tablet (20 mg total) by mouth daily as needed for erectile dysfunction. 10 tablet 2   No current facility-administered medications for this visit.     Psychiatric Specialty Exam: Note difficulty in obtaining a full mental side exam in the context of phone communication Review of Systems  All other systems reviewed and are negative.  There were no vitals taken for this visit.There is no height or weight on file to calculate BMI.  General Appearance: NA  Eye Contact:  NA  Speech:  Normal   Volume:  Normal  Mood:  Reports mood as stable , denies depression  Affect:  Appears reactive   Thought Process:  Goal Directed  Orientation:  Full (Time, Place, and Person)  Thought Content: denies hallucinations, no delusions , denies SI, denies HI - presents  future oriented , focusing on financial issues as above   Suicidal Thoughts:  No  Homicidal Thoughts:  No  Memory:  Recent and remote grossly intact   Judgement:  Fair  Insight:  Fair  Psychomotor Activity:  NA  Concentration:  Concentration: reports improved  and Attention Span: reports improved   Recall:  Good  Fund of Knowledge: Good  Language: Good  Akathisia:  Negative  Handed:  Right  AIMS (if indicated): not done  Assets:  Communication Skills Desire for Improvement Financial Resources/Insurance Housing Resilience  ADL's:  Intact  Cognition: WNL  Sleep:  Fair    Assessment and Plan:  50 year old male, combat veteran, past history of PTSD diagnoses, has also been diagnosed with ADHD.    Reports he has been facing some increased financial stressors, leading him to rent out his home and currently staying with a family member in [facility], which he states is temporary. He is on [place] Disability which he states he is trying to get adjusted . Endorses some persistent depression and anxiety related to above stressors, denies SI and presents future oriented . He reports some persistent hypervigilance and intrusive memories . He is on  Zoloft 50 mgr QDAY, which he states has not helped significantly thus far . He also endorses some sexual side effects ( decreased libido) which we reviewed could also be related to ETOH. Of note, he stopped dextroamphetamine a few weeks ago, and has not noticed any increased symptoms off it .  He drinks regularly but not daily, at times 3-4 shots of liquor per episode. Currently does not endorse negative outcomes related to ETOH or having any WDL symptoms when not drinking . He does endorse past history of DUI.  We discussed options and he is interested in trying another antidepressant . Options were reviewed, agrees to Prozac trial, side effects reviewed, to include potential for SSRI related sexual side effects.   We also reviewed negative impact that alcohol can have on physical health as well as its likelihood of having a deleterious effect on mood and anxiety symptoms.    Dx: PTSD by history, Depression, consider MDD versus Alcohol Induced, versus Adjustment Disorder with Depressed Mood , Alcohol Use Disorder    Plan:   D/ C Zoloft . No longer taking Dextroamphetamine Start Prozac 10 mgr QDAY # 30, 1 refill  Will see in four weeks, agrees to contact clinic sooner should there be any worsening or concern prior.  Encouraged alcohol abstinence     Craige Cotta, MD 09/25/2021, 10:33 AM

## 2021-10-23 ENCOUNTER — Encounter (HOSPITAL_COMMUNITY): Payer: Self-pay

## 2021-10-23 ENCOUNTER — Telehealth (HOSPITAL_COMMUNITY): Payer: No Typology Code available for payment source | Admitting: Psychiatry

## 2021-10-23 ENCOUNTER — Other Ambulatory Visit: Payer: Self-pay

## 2021-10-23 NOTE — Progress Notes (Signed)
Patient ID: Jonathon Patterson, male   DOB: [birth year], 50 y.o.   MRN: [id number] [facility] MD/PA/NP OP Progress Note  10/23/2021 1:06 PM Jonathon Patterson  MRN:  [id number]  Attempted to reach patient via phone contact/video conferencing contact for scheduled medication management appointment . Patient not available . Unable to leave voice mail as voice mail reported as full. Will ask front office staff to contact patient in order to reschedule    Craige Cotta, MD 10/23/2021, 1:06 PM   Patient ID: Jonathon Patterson, male   DOB: Apr 04, 1971, 50 y.o.   MRN: [id number]
